# Patient Record
Sex: Male | Born: 1975 | Race: Black or African American | Hispanic: No | Marital: Single | Smoking: Current every day smoker
Health system: Southern US, Community
[De-identification: ages and names within clinical notes are randomized; demographics above are authoritative.]

## PROBLEM LIST (undated history)

## (undated) DIAGNOSIS — F259 Schizoaffective disorder, unspecified: Secondary | ICD-10-CM

## (undated) DIAGNOSIS — F319 Bipolar disorder, unspecified: Secondary | ICD-10-CM

## (undated) HISTORY — PX: JOINT REPLACEMENT: SHX530

---

## 2009-02-05 ENCOUNTER — Other Ambulatory Visit: Payer: Self-pay

## 2009-02-05 ENCOUNTER — Other Ambulatory Visit: Payer: Self-pay | Admitting: Emergency Medicine

## 2009-02-06 ENCOUNTER — Ambulatory Visit: Payer: Self-pay | Admitting: *Deleted

## 2009-02-06 ENCOUNTER — Inpatient Hospital Stay (HOSPITAL_COMMUNITY): Admission: AD | Admit: 2009-02-06 | Discharge: 2009-02-10 | Payer: Self-pay | Admitting: *Deleted

## 2009-02-23 ENCOUNTER — Emergency Department (HOSPITAL_COMMUNITY): Admission: EM | Admit: 2009-02-23 | Discharge: 2009-02-24 | Payer: Self-pay | Admitting: Emergency Medicine

## 2009-03-07 ENCOUNTER — Ambulatory Visit (HOSPITAL_COMMUNITY): Admission: RE | Admit: 2009-03-07 | Discharge: 2009-03-07 | Payer: Self-pay | Admitting: Unknown Physician Specialty

## 2009-04-21 ENCOUNTER — Inpatient Hospital Stay (HOSPITAL_COMMUNITY): Admission: EM | Admit: 2009-04-21 | Discharge: 2009-04-27 | Payer: Self-pay | Admitting: Emergency Medicine

## 2009-04-30 ENCOUNTER — Emergency Department (HOSPITAL_COMMUNITY): Admission: EM | Admit: 2009-04-30 | Discharge: 2009-04-30 | Payer: Self-pay | Admitting: Emergency Medicine

## 2009-05-07 ENCOUNTER — Emergency Department (HOSPITAL_COMMUNITY): Admission: EM | Admit: 2009-05-07 | Discharge: 2009-05-08 | Payer: Self-pay | Admitting: Emergency Medicine

## 2009-10-02 ENCOUNTER — Other Ambulatory Visit: Payer: Self-pay | Admitting: Emergency Medicine

## 2009-10-03 ENCOUNTER — Ambulatory Visit: Payer: Self-pay | Admitting: Psychiatry

## 2009-10-04 ENCOUNTER — Inpatient Hospital Stay (HOSPITAL_COMMUNITY): Admission: AD | Admit: 2009-10-04 | Discharge: 2009-10-06 | Payer: Self-pay | Admitting: Psychiatry

## 2010-01-22 ENCOUNTER — Emergency Department (HOSPITAL_COMMUNITY): Admission: EM | Admit: 2010-01-22 | Discharge: 2010-01-22 | Payer: Self-pay | Admitting: Emergency Medicine

## 2010-04-19 ENCOUNTER — Emergency Department (HOSPITAL_COMMUNITY): Admission: EM | Admit: 2010-04-19 | Discharge: 2010-04-19 | Payer: Self-pay | Admitting: Family Medicine

## 2010-06-14 ENCOUNTER — Emergency Department (HOSPITAL_COMMUNITY): Admission: EM | Admit: 2010-06-14 | Discharge: 2010-06-14 | Payer: Self-pay | Admitting: Emergency Medicine

## 2010-06-19 ENCOUNTER — Emergency Department (HOSPITAL_COMMUNITY): Admission: EM | Admit: 2010-06-19 | Discharge: 2010-06-19 | Payer: Self-pay | Admitting: Emergency Medicine

## 2010-07-05 ENCOUNTER — Emergency Department (HOSPITAL_COMMUNITY)
Admission: EM | Admit: 2010-07-05 | Discharge: 2010-07-05 | Payer: Self-pay | Source: Home / Self Care | Admitting: Emergency Medicine

## 2010-11-28 ENCOUNTER — Emergency Department (HOSPITAL_COMMUNITY)
Admission: EM | Admit: 2010-11-28 | Discharge: 2010-11-28 | Payer: Self-pay | Source: Home / Self Care | Admitting: Family Medicine

## 2011-01-22 ENCOUNTER — Inpatient Hospital Stay (INDEPENDENT_AMBULATORY_CARE_PROVIDER_SITE_OTHER)
Admission: RE | Admit: 2011-01-22 | Discharge: 2011-01-22 | Disposition: A | Discharge status: Home / Self Care | Payer: Medicare Other | Source: Ambulatory Visit | Attending: Emergency Medicine | Admitting: Emergency Medicine

## 2011-01-22 ENCOUNTER — Ambulatory Visit (INDEPENDENT_AMBULATORY_CARE_PROVIDER_SITE_OTHER): Payer: Self-pay

## 2011-01-22 DIAGNOSIS — L02219 Cutaneous abscess of trunk, unspecified: Secondary | ICD-10-CM

## 2011-01-24 ENCOUNTER — Inpatient Hospital Stay (INDEPENDENT_AMBULATORY_CARE_PROVIDER_SITE_OTHER)
Admission: RE | Admit: 2011-01-24 | Discharge: 2011-01-24 | Disposition: A | Discharge status: Home / Self Care | Payer: Medicare Other | Source: Ambulatory Visit | Attending: Family Medicine | Admitting: Family Medicine

## 2011-01-24 DIAGNOSIS — L02219 Cutaneous abscess of trunk, unspecified: Secondary | ICD-10-CM

## 2011-01-25 LAB — CULTURE, ROUTINE-ABSCESS

## 2011-01-26 ENCOUNTER — Inpatient Hospital Stay (INDEPENDENT_AMBULATORY_CARE_PROVIDER_SITE_OTHER)
Admission: RE | Admit: 2011-01-26 | Discharge: 2011-01-26 | Disposition: A | Discharge status: Home / Self Care | Payer: Medicare Other | Source: Ambulatory Visit

## 2011-01-26 DIAGNOSIS — L02219 Cutaneous abscess of trunk, unspecified: Secondary | ICD-10-CM

## 2011-01-28 ENCOUNTER — Inpatient Hospital Stay (HOSPITAL_COMMUNITY)
Admission: RE | Admit: 2011-01-28 | Discharge: 2011-01-28 | Disposition: A | Discharge status: Home / Self Care | Payer: Medicare Other | Source: Ambulatory Visit | Attending: Emergency Medicine | Admitting: Emergency Medicine

## 2011-02-18 LAB — RPR: RPR Ser Ql: NONREACTIVE

## 2011-02-18 LAB — GC/CHLAMYDIA PROBE AMP, GENITAL: Chlamydia, DNA Probe: NEGATIVE

## 2011-02-24 LAB — URINALYSIS, ROUTINE W REFLEX MICROSCOPIC: Ketones, ur: NEGATIVE mg/dL

## 2011-02-24 LAB — COMPREHENSIVE METABOLIC PANEL
AST: 43 U/L — ABNORMAL HIGH (ref 0–37)
Albumin: 3.8 g/dL (ref 3.5–5.2)
BUN: 6 mg/dL (ref 6–23)
Calcium: 9 mg/dL (ref 8.4–10.5)
Chloride: 106 mEq/L (ref 96–112)
GFR calc Af Amer: >60 mL/min (ref >60)
Total Bilirubin: 1.3 mg/dL — ABNORMAL HIGH (ref 0.3–1.2)
Total Protein: 5.8 g/dL — ABNORMAL LOW (ref 6.0–8.3)

## 2011-02-24 LAB — CBC
Hemoglobin: 15.1 g/dL (ref 13.0–17.0)
MCHC: 34 g/dL (ref 30.0–36.0)
Platelets: 175 10*3/uL (ref 150–400)
RBC: 4.72 MIL/uL (ref 4.22–5.81)
RDW: 14.1 % (ref 11.5–15.5)

## 2011-02-24 LAB — DIFFERENTIAL
Lymphocytes Relative: 26 % (ref 12–46)
Lymphs Abs: 1.2 10*3/uL (ref 0.7–4.0)
Monocytes Relative: 9 % (ref 3–12)
Neutro Abs: 2.9 10*3/uL (ref 1.7–7.7)
Neutrophils Relative %: 63 % (ref 43–77)

## 2011-02-24 LAB — LIPASE, BLOOD: Lipase: 36 U/L (ref 11–59)

## 2011-02-26 LAB — POCT URINALYSIS DIP (DEVICE)
Bilirubin Urine: NEGATIVE
Glucose, UA: NEGATIVE mg/dL
Hgb urine dipstick: NEGATIVE
Ketones, ur: NEGATIVE mg/dL
Specific Gravity, Urine: 1.015 (ref 1.005–1.030)
Urobilinogen, UA: 4 mg/dL — ABNORMAL HIGH (ref 0.0–1.0)

## 2011-02-26 LAB — CK: Total CK: 131 U/L (ref 7–232)

## 2011-02-26 LAB — POCT I-STAT, CHEM 8
BUN: 10 mg/dL (ref 6–23)
Creatinine, Ser: 1 mg/dL (ref 0.4–1.5)
Potassium: 4.2 mEq/L (ref 3.5–5.1)
Sodium: 143 mEq/L (ref 135–145)
TCO2: 27 mmol/L (ref 0–100)

## 2011-03-14 LAB — DIFFERENTIAL
Basophils Absolute: 0 10*3/uL (ref 0.0–0.1)
Basophils Relative: 0 % (ref 0–1)
Eosinophils Absolute: 0.1 10*3/uL (ref 0.0–0.7)
Eosinophils Relative: 2 % (ref 0–5)
Lymphocytes Relative: 26 % (ref 12–46)
Lymphs Abs: 1.7 10*3/uL (ref 0.7–4.0)
Monocytes Absolute: 0.4 10*3/uL (ref 0.1–1.0)
Monocytes Relative: 7 % (ref 3–12)
Neutro Abs: 4.2 10*3/uL (ref 1.7–7.7)
Neutrophils Relative %: 65 % (ref 43–77)

## 2011-03-14 LAB — CBC
HCT: 46.3 % (ref 39.0–52.0)
Hemoglobin: 15.5 g/dL (ref 13.0–17.0)
MCHC: 33.5 g/dL (ref 30.0–36.0)
Platelets: 192 10*3/uL (ref 150–400)
RDW: 14.1 % (ref 11.5–15.5)

## 2011-03-14 LAB — RAPID URINE DRUG SCREEN, HOSP PERFORMED
Amphetamines: NOT DETECTED
Barbiturates: NOT DETECTED
Benzodiazepines: NOT DETECTED
Cocaine: NOT DETECTED
Opiates: NOT DETECTED
Tetrahydrocannabinol: NOT DETECTED

## 2011-03-14 LAB — BASIC METABOLIC PANEL
BUN: 14 mg/dL (ref 6–23)
CO2: 27 mEq/L (ref 19–32)
Calcium: 9.6 mg/dL (ref 8.4–10.5)
Chloride: 107 mEq/L (ref 96–112)
Creatinine, Ser: 1.14 mg/dL (ref 0.4–1.5)
GFR calc Af Amer: >60 mL/min (ref >60)
GFR calc non Af Amer: >60 mL/min (ref >60)
Glucose, Bld: 124 mg/dL — ABNORMAL HIGH (ref 70–99)
Potassium: 3.6 mEq/L (ref 3.5–5.1)
Sodium: 141 mEq/L (ref 135–145)

## 2011-03-14 LAB — ETHANOL: Alcohol, Ethyl (B): <5 mg/dL (ref 0–10)

## 2011-03-19 LAB — CBC
HCT: 41.8 % (ref 39.0–52.0)
Hemoglobin: 14.2 g/dL (ref 13.0–17.0)
MCV: 91 fL (ref 78.0–100.0)
MCV: 92.7 fL (ref 78.0–100.0)
Platelets: 141 10*3/uL — ABNORMAL LOW (ref 150–400)
RBC: 4.17 MIL/uL — ABNORMAL LOW (ref 4.22–5.81)
RBC: 4.56 MIL/uL (ref 4.22–5.81)
WBC: 14.8 10*3/uL — ABNORMAL HIGH (ref 4.0–10.5)
WBC: 5.7 10*3/uL (ref 4.0–10.5)

## 2011-03-19 LAB — BASIC METABOLIC PANEL
BUN: 4 mg/dL — ABNORMAL LOW (ref 6–23)
BUN: 6 mg/dL (ref 6–23)
Calcium: 8.6 mg/dL (ref 8.4–10.5)
Calcium: 8.7 mg/dL (ref 8.4–10.5)
Calcium: 8.9 mg/dL (ref 8.4–10.5)
Chloride: 106 mEq/L (ref 96–112)
Chloride: 112 mEq/L (ref 96–112)
Creatinine, Ser: 0.74 mg/dL (ref 0.4–1.5)
Creatinine, Ser: 0.86 mg/dL (ref 0.4–1.5)
Creatinine, Ser: 1.06 mg/dL (ref 0.4–1.5)
GFR calc Af Amer: >60 mL/min (ref >60)
GFR calc Af Amer: >60 mL/min (ref >60)
GFR calc Af Amer: >60 mL/min (ref >60)
GFR calc non Af Amer: >60 mL/min (ref >60)
GFR calc non Af Amer: >60 mL/min (ref >60)
GFR calc non Af Amer: >60 mL/min (ref >60)
Potassium: 3.6 mEq/L (ref 3.5–5.1)
Potassium: 4 mEq/L (ref 3.5–5.1)
Sodium: 141 mEq/L (ref 135–145)

## 2011-03-19 LAB — DIFFERENTIAL
Eosinophils Absolute: 0.1 10*3/uL (ref 0.0–0.7)
Lymphocytes Relative: 10 % — ABNORMAL LOW (ref 12–46)
Lymphs Abs: 1.2 10*3/uL (ref 0.7–4.0)
Lymphs Abs: 1.9 10*3/uL (ref 0.7–4.0)
Monocytes Absolute: 0.5 10*3/uL (ref 0.1–1.0)
Monocytes Relative: 4 % (ref 3–12)
Neutro Abs: 11 10*3/uL — ABNORMAL HIGH (ref 1.7–7.7)
Neutro Abs: 3.3 10*3/uL (ref 1.7–7.7)
Neutrophils Relative %: 59 % (ref 43–77)

## 2011-03-19 LAB — RAPID URINE DRUG SCREEN, HOSP PERFORMED
Amphetamines: NOT DETECTED
Cocaine: NOT DETECTED
Opiates: NOT DETECTED
Tetrahydrocannabinol: NOT DETECTED

## 2011-03-19 LAB — URINALYSIS, ROUTINE W REFLEX MICROSCOPIC
Glucose, UA: NEGATIVE mg/dL
Hgb urine dipstick: NEGATIVE
Protein, ur: 30 mg/dL — AB
Specific Gravity, Urine: 1.029 (ref 1.005–1.030)

## 2011-03-19 LAB — URINE MICROSCOPIC-ADD ON

## 2011-03-19 LAB — BLOOD GAS, ARTERIAL
Bicarbonate: 24.3 mEq/L — ABNORMAL HIGH (ref 20.0–24.0)
TCO2: 25.6 mmol/L (ref 0–100)
pCO2 arterial: 41.3 mmHg (ref 35.0–45.0)
pH, Arterial: 7.388 (ref 7.350–7.450)

## 2011-03-19 LAB — PROTIME-INR
INR: 1.1 (ref 0.00–1.49)
Prothrombin Time: 14.4 seconds (ref 11.6–15.2)

## 2011-03-19 LAB — CHOLESTEROL, TOTAL: Cholesterol: 146 mg/dL (ref 0–200)

## 2011-03-19 LAB — TYPE AND SCREEN: Antibody Screen: NEGATIVE

## 2011-03-19 LAB — ETHANOL: Alcohol, Ethyl (B): <5 mg/dL (ref 0–10)

## 2011-03-26 LAB — BASIC METABOLIC PANEL
CO2: 29 mEq/L (ref 19–32)
Chloride: 102 mEq/L (ref 96–112)
GFR calc non Af Amer: >60 mL/min (ref >60)
Glucose, Bld: 106 mg/dL — ABNORMAL HIGH (ref 70–99)
Potassium: 3.9 mEq/L (ref 3.5–5.1)
Sodium: 140 mEq/L (ref 135–145)

## 2011-03-26 LAB — CBC
HCT: 47 % (ref 39.0–52.0)
Hemoglobin: 15.6 g/dL (ref 13.0–17.0)
MCHC: 33.1 g/dL (ref 30.0–36.0)
MCV: 92.6 fL (ref 78.0–100.0)
RDW: 13.4 % (ref 11.5–15.5)

## 2011-03-26 LAB — DIFFERENTIAL
Basophils Absolute: 0 10*3/uL (ref 0.0–0.1)
Eosinophils Relative: 1 % (ref 0–5)
Lymphocytes Relative: 25 % (ref 12–46)
Monocytes Absolute: 0.4 10*3/uL (ref 0.1–1.0)

## 2011-03-26 LAB — RAPID URINE DRUG SCREEN, HOSP PERFORMED: Benzodiazepines: NOT DETECTED
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
## 2011-04-23 NOTE — Discharge Summary (Signed)
[name], [name]              ACCOUNT NO.:  [id number]   MEDICAL RECORD NO.:  [id number]          PATIENT TYPE:  INP   LOCATION:  5151                         FACILITY:  [facility]   PHYSICIAN:  Gabrielle Dare. Janee Morn, M.D.DATE OF BIRTH:  [birth year]   DATE OF ADMISSION:  04/20/2009  DATE OF DISCHARGE:                               DISCHARGE SUMMARY   ADDENDUM     Since the prior discharge summary, the patient has been stable from a  medical standpoint.  It was discovered that the patient could not obtain  a full liquid diet at an assisted living facility.  Therefore, search  was undertaken for a skilled nursing facility where he could get proper  nutrition.  A facility was located.  He was able to be transferred there  in good condition.  None of his medications had changed since the last  dictation nor have his follow-up plans changed.      Earney Hamburg, P.A.      Gabrielle Dare Janee Morn, M.D.  Electronically Signed    MJ/MEDQ  D:  04/27/2009  T:  04/27/2009  Job:  [id number]
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
## 2011-06-03 ENCOUNTER — Emergency Department (HOSPITAL_COMMUNITY)
Admission: EM | Admit: 2011-06-03 | Discharge: 2011-06-03 | Payer: Medicare Other | Attending: Emergency Medicine | Admitting: Emergency Medicine

## 2011-06-03 ENCOUNTER — Emergency Department (HOSPITAL_COMMUNITY): Payer: Medicare Other

## 2011-06-03 DIAGNOSIS — I498 Other specified cardiac arrhythmias: Secondary | ICD-10-CM | POA: Insufficient documentation

## 2011-06-03 DIAGNOSIS — R569 Unspecified convulsions: Secondary | ICD-10-CM | POA: Insufficient documentation

## 2011-06-03 DIAGNOSIS — F79 Unspecified intellectual disabilities: Secondary | ICD-10-CM | POA: Insufficient documentation

## 2011-06-03 DIAGNOSIS — I10 Essential (primary) hypertension: Secondary | ICD-10-CM | POA: Insufficient documentation

## 2011-06-03 DIAGNOSIS — Z046 Encounter for general psychiatric examination, requested by authority: Secondary | ICD-10-CM | POA: Insufficient documentation

## 2011-06-03 LAB — CBC
Hemoglobin: 14.7 g/dL (ref 13.0–17.0)
MCH: 30.1 pg (ref 26.0–34.0)
RBC: 4.89 MIL/uL (ref 4.22–5.81)

## 2011-06-03 LAB — DIFFERENTIAL
Basophils Absolute: 0 10*3/uL (ref 0.0–0.1)
Basophils Relative: 0 % (ref 0–1)
Monocytes Relative: 10 % (ref 3–12)
Neutro Abs: 5.4 10*3/uL (ref 1.7–7.7)
Neutrophils Relative %: 74 % (ref 43–77)

## 2011-06-03 LAB — RAPID URINE DRUG SCREEN, HOSP PERFORMED
Amphetamines: NOT DETECTED
Benzodiazepines: POSITIVE — AB
Cocaine: NOT DETECTED
Opiates: NOT DETECTED
Tetrahydrocannabinol: NOT DETECTED

## 2011-06-03 LAB — BASIC METABOLIC PANEL
CO2: 26 mEq/L (ref 19–32)
Calcium: 9.7 mg/dL (ref 8.4–10.5)
Creatinine, Ser: 1.07 mg/dL (ref 0.50–1.35)
Glucose, Bld: 92 mg/dL (ref 70–99)

## 2012-11-04 IMAGING — CR DG ABDOMEN 1V
2 series · 2 of 2 positions shown · non-contrast
Comparison: CT 06/14/2010

CLINICAL DATA: Right upper quadrant abscess

ABDOMEN - 1 VIEW

[view not recorded (1 of 2)]
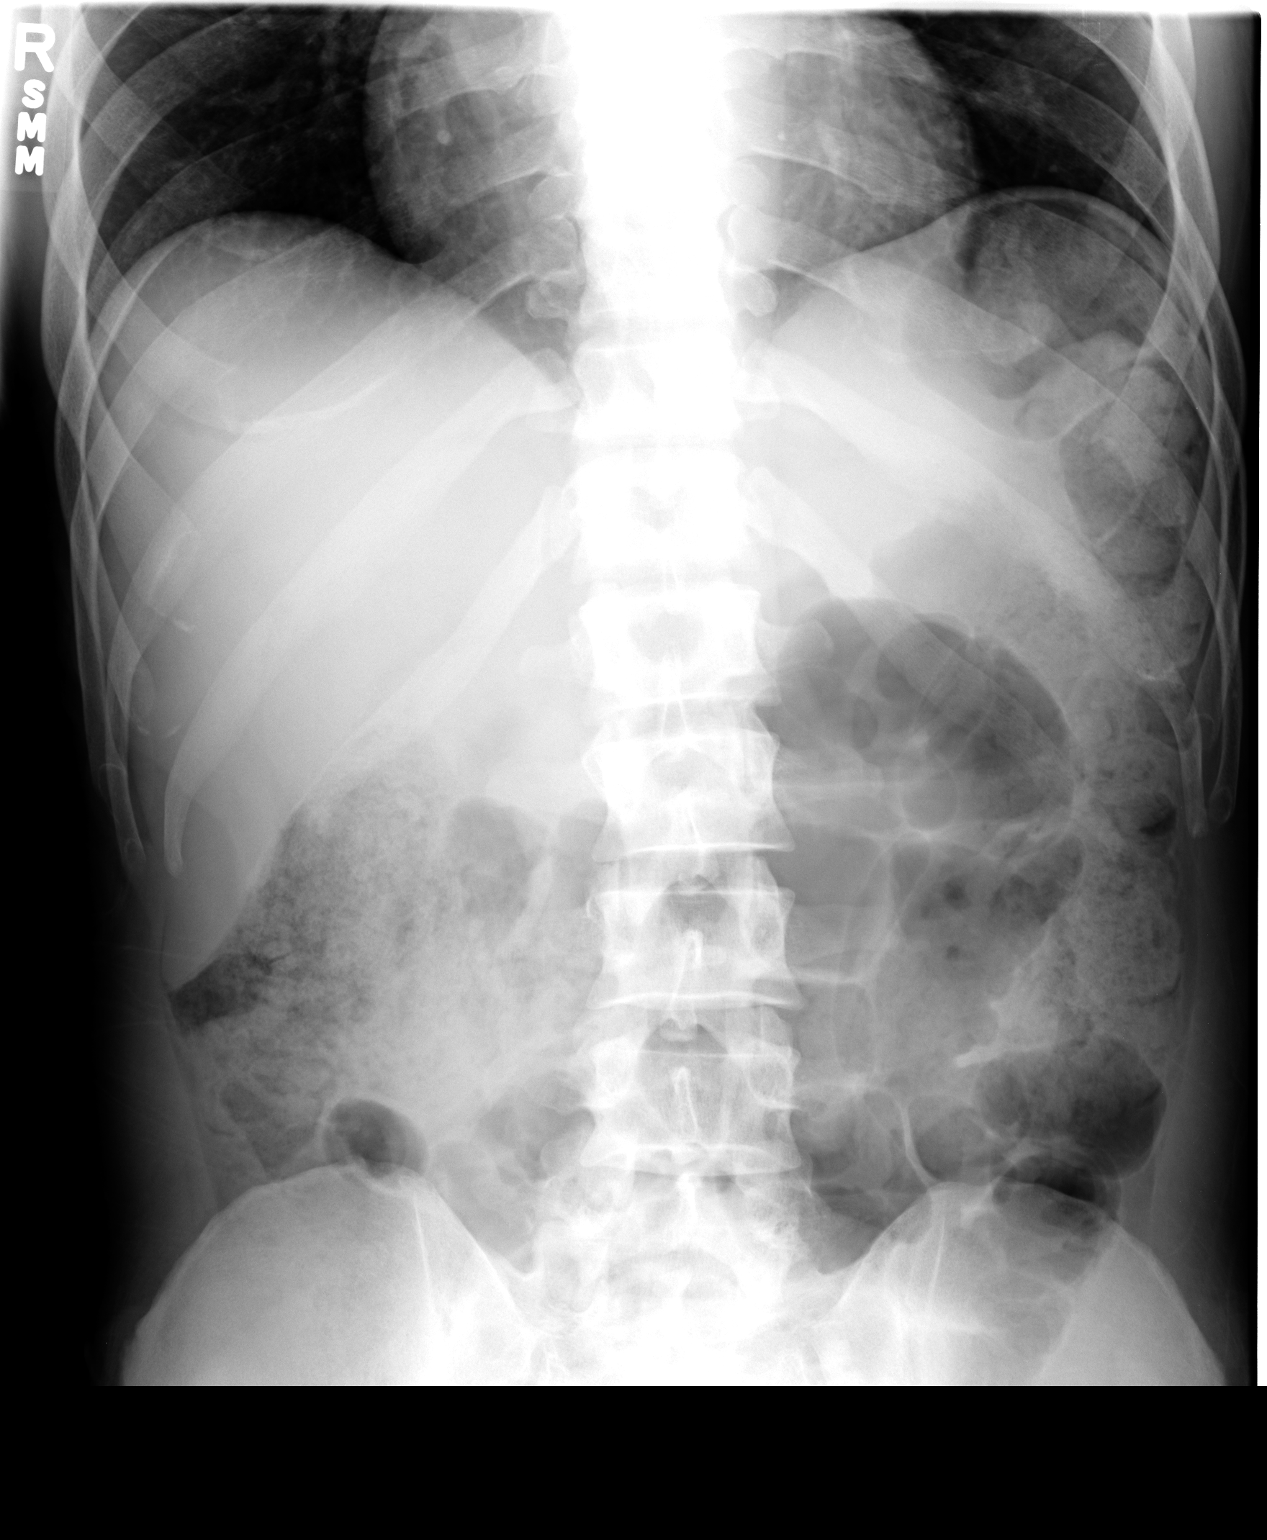

[view not recorded (2 of 2)]
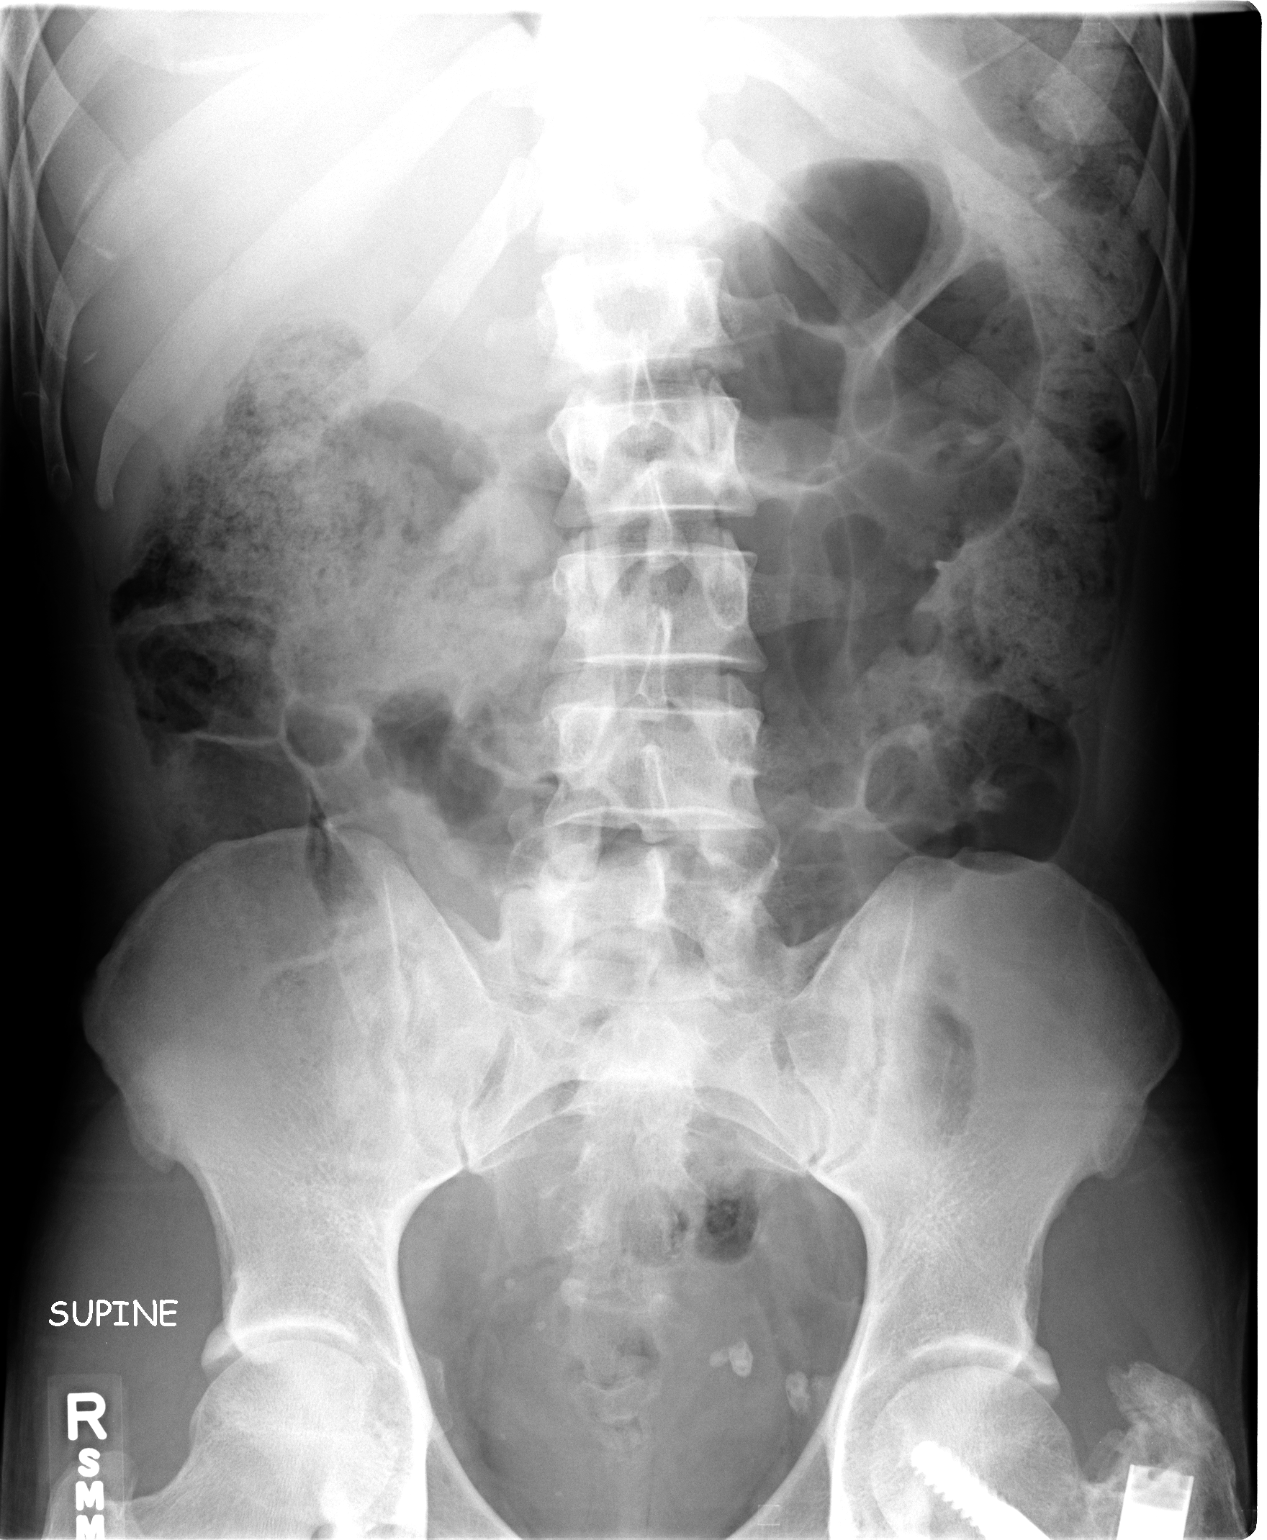

[2 of 2 positions shown; findings below may reference images not displayed]

FINDINGS: Lung bases are clear.  No dilated loops of large or small
bowel.  There is a moderate volume of stool in the ascending and
descending colon.  There is gas the rectosigmoid colon.

No evidence of abscess in the right upper quadrant.  Left hip
fixation.
IMPRESSION: No acute abdominal process.  No  abnormality the right upper
quadrant.

Moderate volume stool suggests mild constipation.

## 2013-03-16 IMAGING — CT CT HEAD W/O CM
1 series · 16 of 30 positions shown, 20 images · non-contrast
Comparison: 06/19/2010

CLINICAL DATA: Pain post seizure

CT HEAD WITHOUT CONTRAST
TECHNIQUE: Contiguous axial images were obtained from the base of
the skull through the vertex without contrast.

[Series 2: head_seq 4.5 h37s st · axial · 0.43mm/px · z∈[-191,-29]mm · 16 of 40 slices shown, 20 images]
[im 2/40  brain]
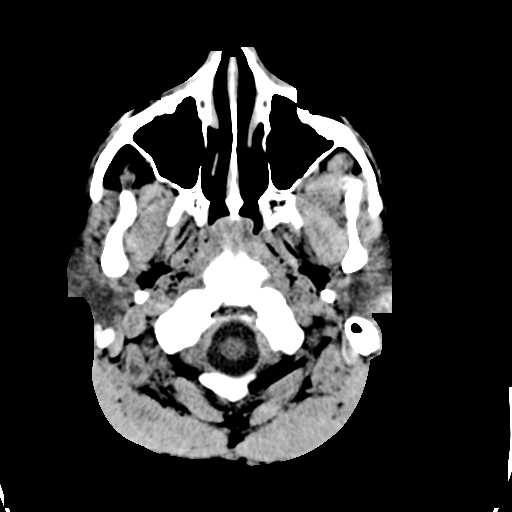
[im 2/40  bone]
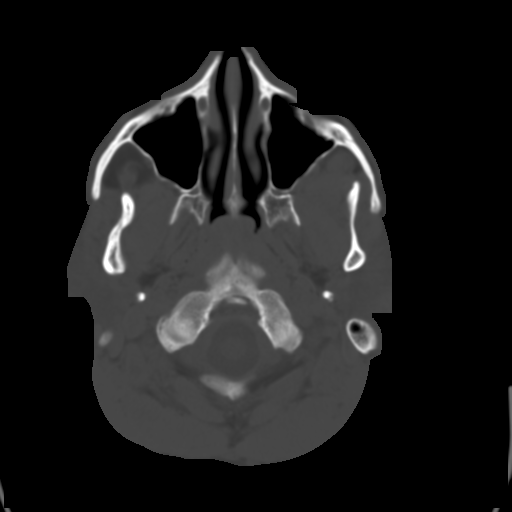
[im 5/40  brain]
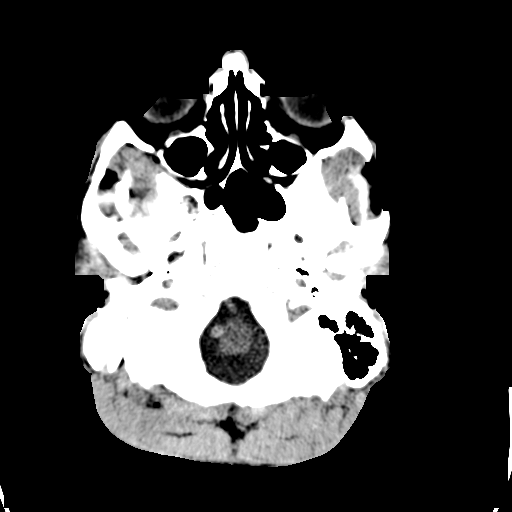
[im 7/40  brain]
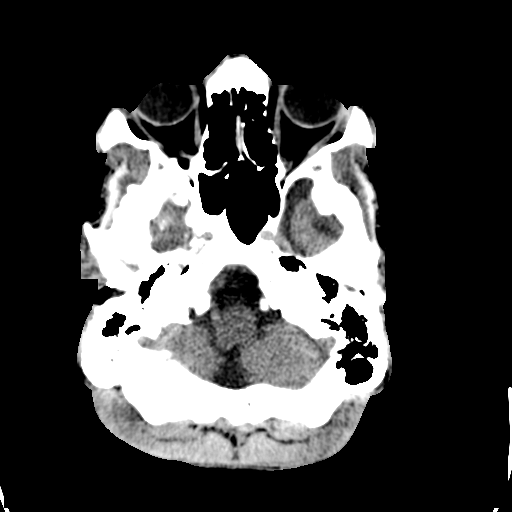
[im 10/40  brain]
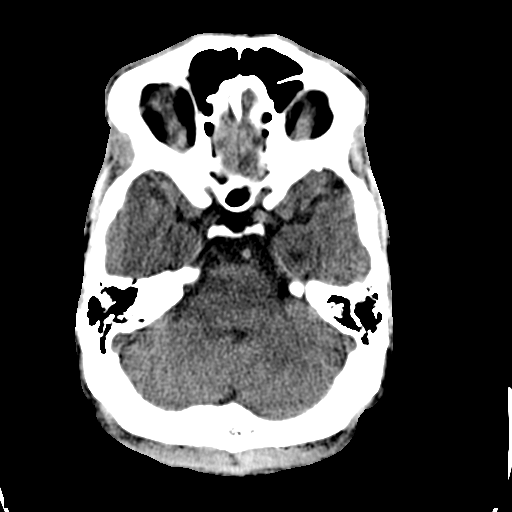
[im 11/40  brain]
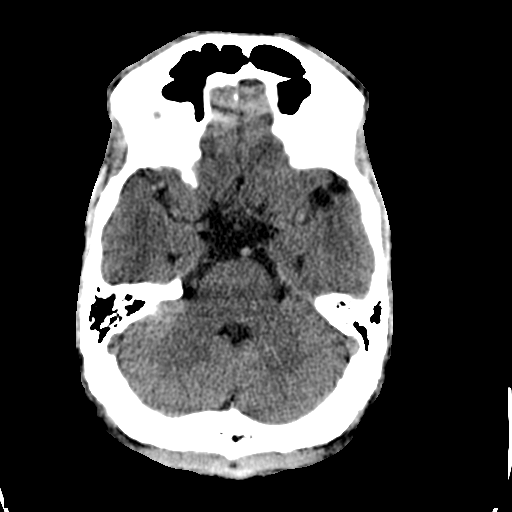
[im 11/40  bone]
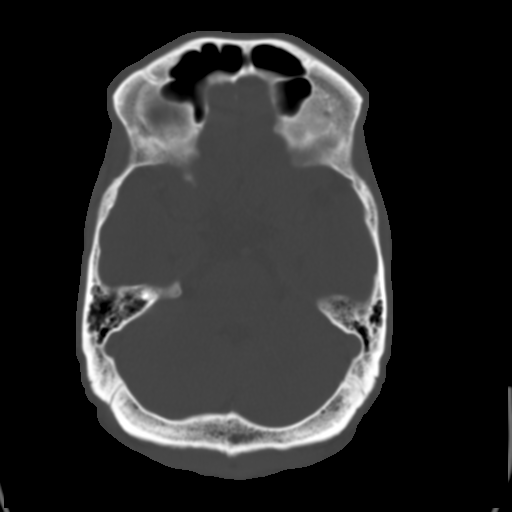
[im 14/40  brain]
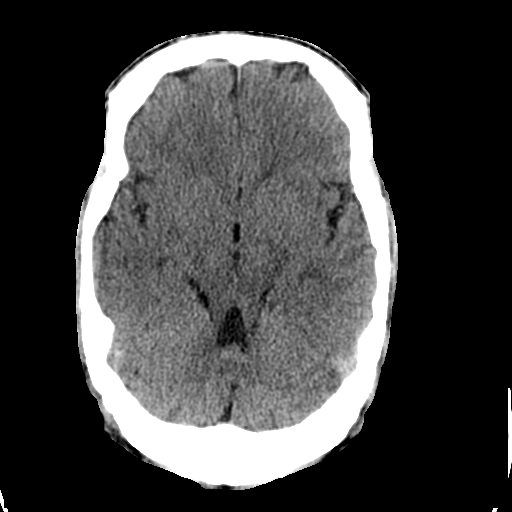
[im 17/40  brain]
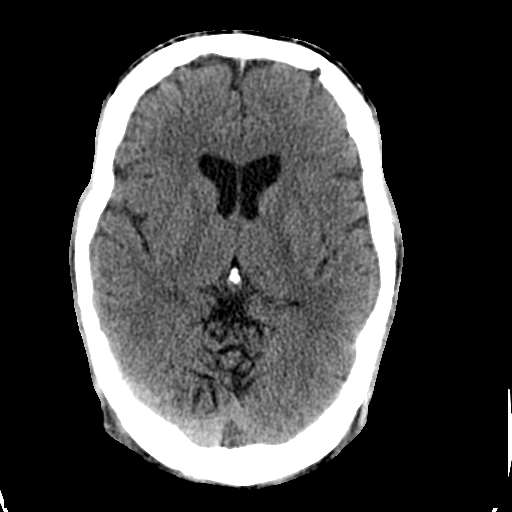
[im 19/40  brain]
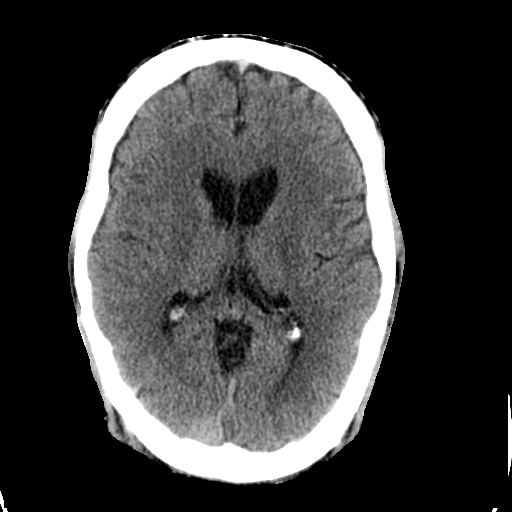
[im 21/40  brain]
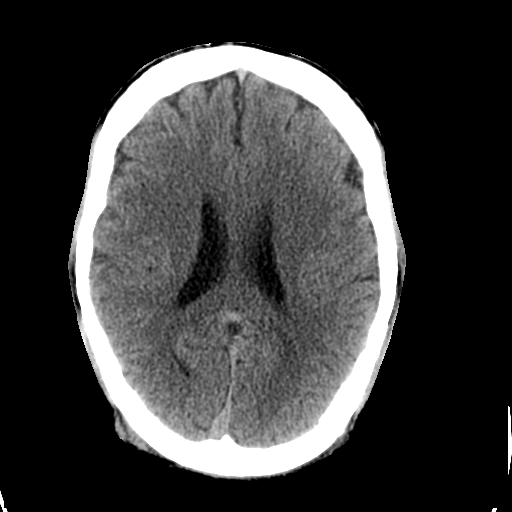
[im 21/40  bone]
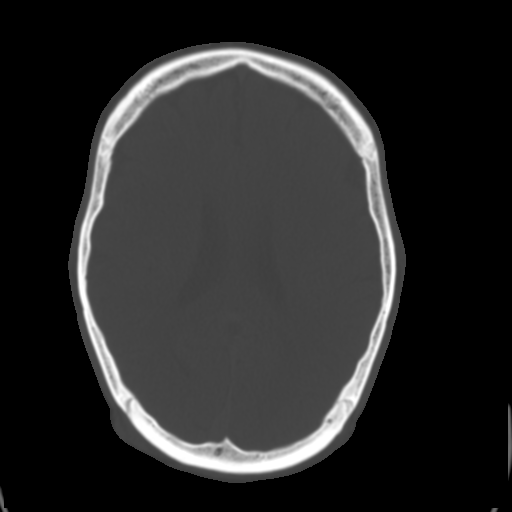
[im 23/40  brain]
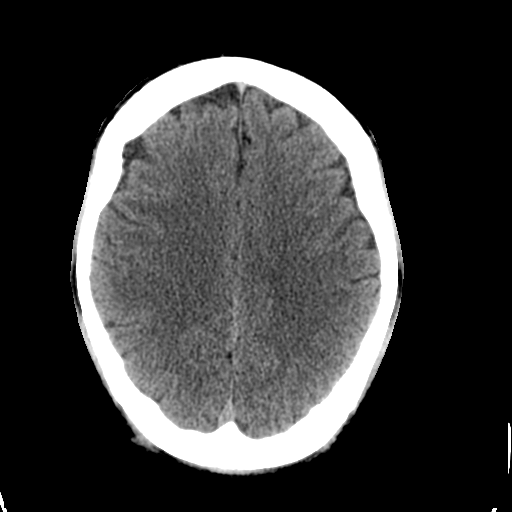
[im 26/40  brain]
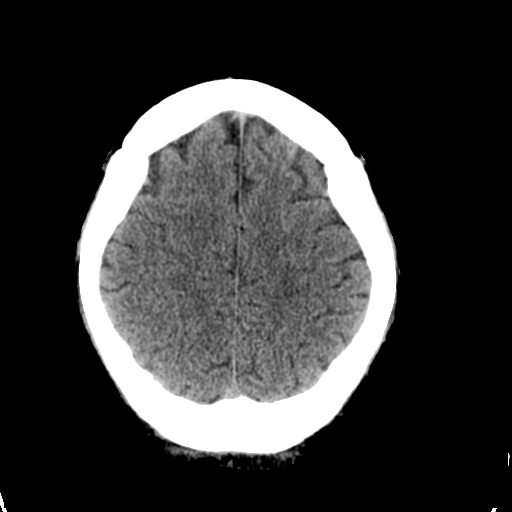
[im 29/40  brain]
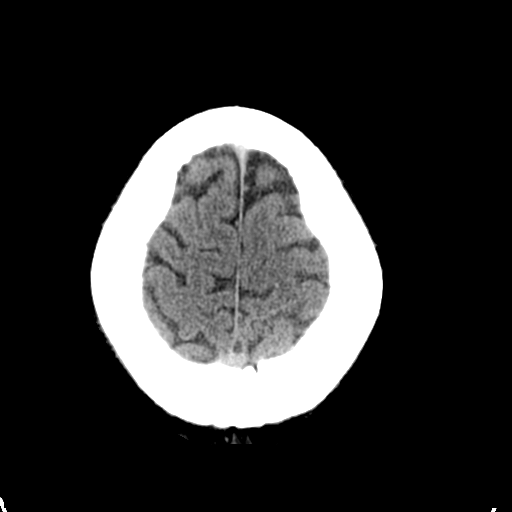
[im 30/40  brain]
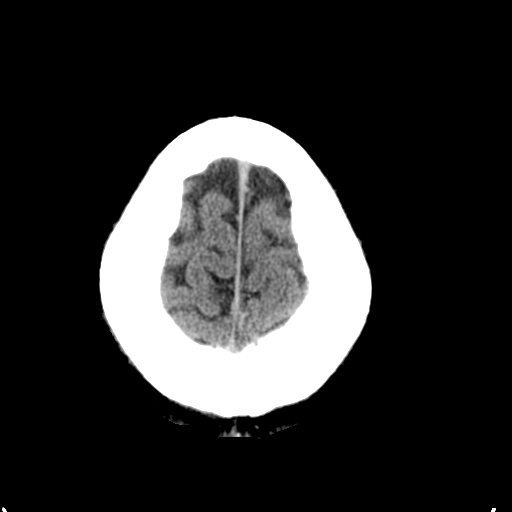
[im 30/40  bone]
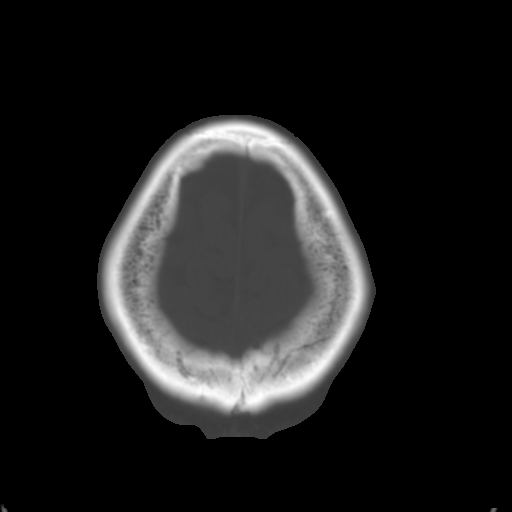
[im 33/40  brain]
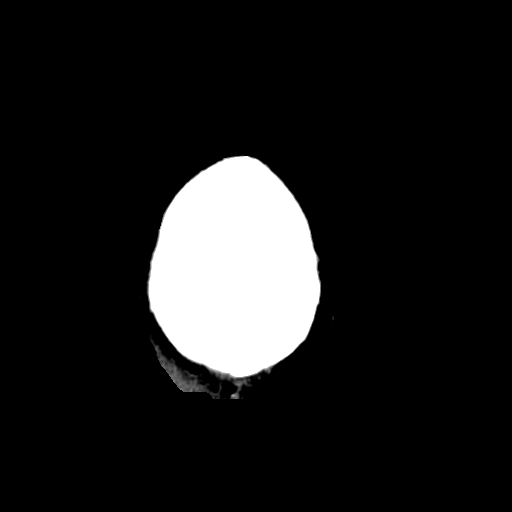
[im 35/40  brain]
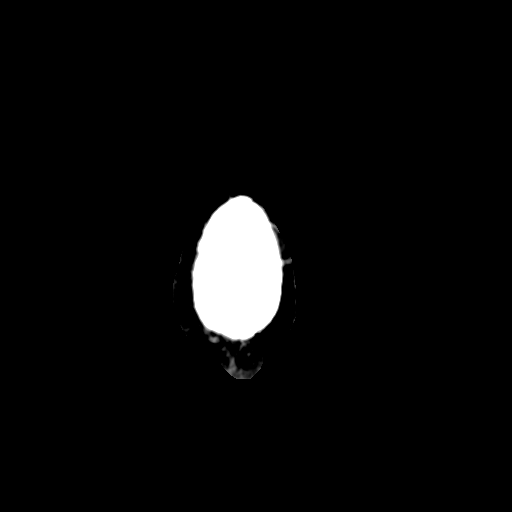
[im 38/40  brain]
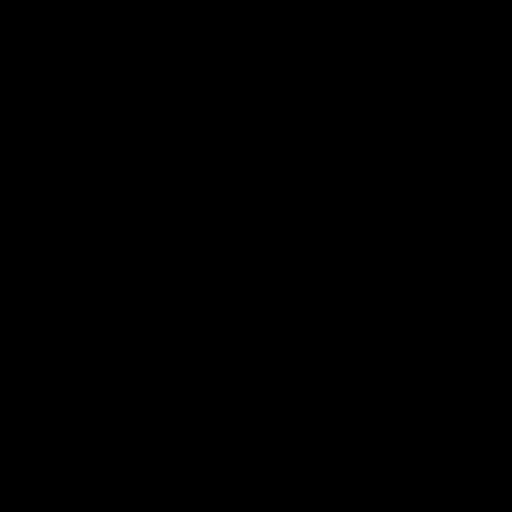

[16 of 30 positions shown; findings below may reference images not displayed]

FINDINGS: No skull fracture is noted.  Paranasal sinuses and
mastoid air cells are unremarkable.

No intracranial hemorrhage, mass effect or midline shift.  No acute
infarction.  No mass lesion is noted on this unenhanced scan.  The
gray and white matter differentiation is preserved.  No intra or
extra-axial fluid collection.
IMPRESSION: No acute intracranial abnormality.

## 2013-08-27 ENCOUNTER — Emergency Department: Payer: Self-pay | Admitting: Emergency Medicine

## 2013-08-27 LAB — COMPREHENSIVE METABOLIC PANEL
Albumin: 3.8 g/dL (ref 3.4–5.0)
Anion Gap: 3 — ABNORMAL LOW (ref 7–16)
BUN: 9 mg/dL (ref 7–18)
Bilirubin,Total: 0.9 mg/dL (ref 0.2–1.0)
Calcium, Total: 9.4 mg/dL (ref 8.5–10.1)
Co2: 29 mmol/L (ref 21–32)
Glucose: 116 mg/dL — ABNORMAL HIGH (ref 65–99)
Osmolality: 279 (ref 275–301)
Potassium: 3.5 mmol/L (ref 3.5–5.1)

## 2013-08-27 LAB — CBC
MCV: 92 fL (ref 80–100)
WBC: 5.6 10*3/uL (ref 3.8–10.6)

## 2013-08-27 LAB — URINALYSIS, COMPLETE
Bacteria: NONE SEEN
Bilirubin,UR: NEGATIVE
Blood: NEGATIVE
Glucose,UR: NEGATIVE mg/dL (ref 0–75)
Ketone: NEGATIVE
Nitrite: NEGATIVE
Ph: 6 (ref 4.5–8.0)
Protein: NEGATIVE
Squamous Epithelial: NONE SEEN

## 2013-08-27 LAB — DRUG SCREEN, URINE
Barbiturates, Ur Screen: NEGATIVE (ref ?–200)
Benzodiazepine, Ur Scrn: NEGATIVE (ref ?–200)
Methadone, Ur Screen: NEGATIVE (ref ?–300)
Phencyclidine (PCP) Ur S: NEGATIVE (ref ?–25)

## 2013-08-27 LAB — ETHANOL
Ethanol %: <0.003 % (ref 0.000–0.080)
Ethanol: <3 mg/dL

## 2013-08-27 LAB — TSH: Thyroid Stimulating Horm: 0.81 u[IU]/mL
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
## 2021-01-25 ENCOUNTER — Other Ambulatory Visit: Payer: Self-pay

## 2021-01-25 ENCOUNTER — Emergency Department
Admission: EM | Admit: 2021-01-25 | Discharge: 2021-01-25 | Disposition: A | Discharge status: Home / Self Care | Payer: Medicare Other | Attending: Emergency Medicine | Admitting: Emergency Medicine

## 2021-01-25 DIAGNOSIS — F17210 Nicotine dependence, cigarettes, uncomplicated: Secondary | ICD-10-CM | POA: Diagnosis not present

## 2021-01-25 DIAGNOSIS — Z9660 Presence of unspecified orthopedic joint implant: Secondary | ICD-10-CM | POA: Insufficient documentation

## 2021-01-25 DIAGNOSIS — K625 Hemorrhage of anus and rectum: Secondary | ICD-10-CM | POA: Diagnosis present

## 2021-01-25 DIAGNOSIS — K922 Gastrointestinal hemorrhage, unspecified: Secondary | ICD-10-CM | POA: Diagnosis not present

## 2021-01-25 LAB — COMPREHENSIVE METABOLIC PANEL
ALT: 43 U/L (ref 0–44)
AST: 39 U/L (ref 15–41)
Albumin: 4.2 g/dL (ref 3.5–5.0)
Alkaline Phosphatase: 155 U/L — ABNORMAL HIGH (ref 38–126)
Anion gap: 8 (ref 5–15)
BUN: 10 mg/dL (ref 6–20)
CO2: 27 mmol/L (ref 22–32)
Calcium: 9.3 mg/dL (ref 8.9–10.3)
Chloride: 106 mmol/L (ref 98–111)
Creatinine, Ser: 0.97 mg/dL (ref 0.61–1.24)
GFR, Estimated: >60 mL/min (ref >60)
Glucose, Bld: 93 mg/dL (ref 70–99)
Potassium: 3.9 mmol/L (ref 3.5–5.1)
Sodium: 141 mmol/L (ref 135–145)
Total Bilirubin: 0.8 mg/dL (ref 0.3–1.2)
Total Protein: 7.1 g/dL (ref 6.5–8.1)

## 2021-01-25 LAB — CBC
HCT: 42.7 % (ref 39.0–52.0)
Hemoglobin: 14.1 g/dL (ref 13.0–17.0)
MCH: 30.4 pg (ref 26.0–34.0)
MCHC: 33 g/dL (ref 30.0–36.0)
MCV: 92 fL (ref 80.0–100.0)
Platelets: 140 10*3/uL — ABNORMAL LOW (ref 150–400)
RBC: 4.64 MIL/uL (ref 4.22–5.81)
RDW: 12.6 % (ref 11.5–15.5)
WBC: 5.3 10*3/uL (ref 4.0–10.5)
nRBC: 0 % (ref 0.0–0.2)

## 2021-01-25 LAB — PROTIME-INR
INR: 1 (ref 0.8–1.2)
Prothrombin Time: 13.1 seconds (ref 11.4–15.2)

## 2021-01-25 NOTE — ED Provider Notes (Signed)
[facility] Emergency Department Provider Note   ____________________________________________   I have reviewed the triage vital signs and the nursing notes.   HISTORY  Chief Complaint Rectal Bleeding   History limited by: Not Limited   HPI [name] is a 45 y.o. male who presents to the emergency department today because of concerns for rectal bleeding.  The patient had an episode of blood on his toilet paper 4 days ago.  He has not noticed any since.  He states he had a similar episode roughly 1 year ago.  He does come from a group home.  He denies any rectal pain.  Per the group home staff his mother was worried that he might have colon cancer since it runs in the family and that is why she wanted him evaluated today. He does have an appointment with his doctor next week.   Records reviewed.   History reviewed. No pertinent past medical history.  There are no problems to display for this patient.   Past Surgical History:  Procedure Laterality Date  . JOINT REPLACEMENT      Prior to Admission medications   Not on File    Allergies Patient has no allergy information on record.  No family history on file.  Social History Social History   Tobacco Use  . Smoking status: Current Every Day Smoker    Packs/day: 1.00    Types: Cigarettes  . Smokeless tobacco: Never Used  Substance Use Topics  . Alcohol use: Never    Review of Systems Constitutional: No fever/chills Eyes: No visual changes. ENT: No sore throat. Cardiovascular: Denies chest pain. Respiratory: Denies shortness of breath. Gastrointestinal: No abdominal pain.  No nausea, no vomiting.  No diarrhea.  Episode of rectal bleeding 4 days ago.  Genitourinary: Negative for dysuria. Musculoskeletal: Negative for back pain. Skin: Negative for rash. Neurological: Negative for headaches, focal weakness or numbness.  ____________________________________________   PHYSICAL  EXAM:  VITAL SIGNS: ED Triage Vitals [01/25/21 1750]  Enc Vitals Group     BP (!) 147/87     Pulse Rate 82     Resp 14     Temp 98.7 F (37.1 C)     Temp Source Oral     SpO2 99 %     Weight      Height 6' 2"  (1.88 m)     Head Circumference      Peak Flow      Pain Score 0   Constitutional: Alert and oriented.  Eyes: Conjunctivae are normal.  ENT      Head: Normocephalic and atraumatic.      Nose: No congestion/rhinnorhea.      Mouth/Throat: Mucous membranes are moist.      Neck: No stridor. Hematological/Lymphatic/Immunilogical: No cervical lymphadenopathy. Cardiovascular: Normal rate, regular rhythm.  No murmurs, rubs, or gallops.  Respiratory: Normal respiratory effort without tachypnea nor retractions. Breath sounds are clear and equal bilaterally. No wheezes/rales/rhonchi. Gastrointestinal: Soft and non tender. No rebound. No guarding.  Rectal: External hemorrhoid, brown stool on glove. GUIAC negative.  Musculoskeletal: Normal range of motion in all extremities. No lower extremity edema. Neurologic:  Normal speech and language. No gross focal neurologic deficits are appreciated.  Skin:  Skin is warm, dry and intact. No rash noted. Psychiatric: Mood and affect are normal. Speech and behavior are normal. Patient exhibits appropriate insight and judgment.  ____________________________________________    LABS (pertinent positives/negatives)  CMP wnl except alk phos 155 CBC wbc 5.3, hgb  14.1, plt 140  ____________________________________________   EKG  None  ____________________________________________    RADIOLOGY  None  ____________________________________________   PROCEDURES  Procedures  ____________________________________________   INITIAL IMPRESSION / ASSESSMENT AND PLAN / ED COURSE  Pertinent labs & imaging results that were available during my care of the patient were reviewed by me and considered in my medical decision making (see chart  for details).   Patient presented to the emergency department today because of concerns for an episode of rectal bleeding 4 days ago.  Patient is guaiac negative today does have an external hemorrhoid.  Did discuss with group home staff that best way to diagnose colon cancer would be colonoscopy so they should talk to primary care about obtaining that. ____________________________________________   FINAL CLINICAL IMPRESSION(S) / ED DIAGNOSES  Final diagnoses:  Lower GI bleed     Note: This dictation was prepared with Dragon dictation. Any transcriptional errors that result from this process are unintentional     Nance Pear, MD 01/25/21 1927

## 2021-01-25 NOTE — Discharge Instructions (Addendum)
Please seek medical attention for any high fevers, chest pain, shortness of breath, change in behavior, persistent vomiting, bloody stool or any other new or concerning symptoms.  

## 2021-01-25 NOTE — ED Triage Notes (Addendum)
Pt to ED from Group home   Pt reports 6 episodes of rectal bleeding since Sunday. Denies blood in bowel movements, denies diarrhea.   States the bleeding is only light amounts in his underwear and when he wipes. Denies any other symptoms. Denies rectal pain/ abdominal pain. Reports he doesn't think he has hemorrhoids.   Pt discussed with Dr Vicente Males.

## 2021-04-05 ENCOUNTER — Ambulatory Visit (INDEPENDENT_AMBULATORY_CARE_PROVIDER_SITE_OTHER): Payer: Medicare Other | Admitting: Gastroenterology

## 2021-04-05 ENCOUNTER — Other Ambulatory Visit: Payer: Self-pay

## 2021-04-05 ENCOUNTER — Encounter: Payer: Self-pay | Admitting: Gastroenterology

## 2021-04-05 VITALS — BP 116/74 | HR 76 | Ht 74.0 in | Wt 192.4 lb

## 2021-04-05 DIAGNOSIS — Z1211 Encounter for screening for malignant neoplasm of colon: Secondary | ICD-10-CM

## 2021-04-05 DIAGNOSIS — K648 Other hemorrhoids: Secondary | ICD-10-CM

## 2021-04-05 MED ORDER — SUPREP BOWEL PREP KIT 17.5-3.13-1.6 GM/177ML PO SOLN: 1.0000 | ORAL | 0 refills | Status: DC

## 2021-04-05 NOTE — Progress Notes (Signed)
Gastroenterology Consultation  Referring Provider:     Mickel Fuchs, MD Primary Care Physician:  Mickel Fuchs, MD Primary Gastroenterologist:  Dr. Servando Snare     Reason for Consultation:     Hemorrhoidal problems        HPI:   [name] is a 45 y.o. y/o male referred for consultation & management of hemorrhoidal problems by Dr. Butler Denmark, Sydnee Cabal, MD.  This patient comes in today with a report of having a family history of colon cancer.  The patient states he had colon cancer and a uncle but nobody else in the family.  He also reports that he has had problems with hemorrhoids in the past but has not had any rectal bleeding or problems in the recent past.  He denies any rectal bleeding for last few months.  He also reports that he is due for colonoscopy.  There is no report of any abdominal pain nausea vomiting fevers chills black stools or bloody stools.  History reviewed. No pertinent past medical history.  Past Surgical History:  Procedure Laterality Date  . JOINT REPLACEMENT      Prior to Admission medications   Medication Sig Start Date End Date Taking? Authorizing Provider  INVEGA TRINZA 546 MG/1.75ML SUSY Inject into the muscle. 03/14/21  Yes [provider]    History reviewed. No pertinent family history.   Social History   Tobacco Use  . Smoking status: Current Every Day Smoker    Packs/day: 1.00    Types: Cigarettes  . Smokeless tobacco: Never Used  Substance Use Topics  . Alcohol use: Never    Allergies as of 04/05/2021  . (No Known Allergies)    Review of Systems:    All systems reviewed and negative except where noted in HPI.   Physical Exam:  BP 116/74   Pulse 76   Ht 6\' 2"  (1.88 m)   Wt 192 lb 6.4 oz (87.3 kg)   BMI 24.70 kg/m  No LMP for male patient. General:   Alert,  Well-developed, well-nourished, pleasant and cooperative in NAD Head:  Normocephalic and atraumatic. Eyes:  Sclera clear, no icterus.   Conjunctiva pink. Ears:   Normal auditory acuity. Neck:  Supple; no masses or thyromegaly. Lungs:  Respirations even and unlabored.  Clear throughout to auscultation.   No wheezes, crackles, or rhonchi. No acute distress. Heart:  Regular rate and rhythm; no murmurs, clicks, rubs, or gallops. Abdomen:  Normal bowel sounds.  No bruits.  Soft, non-tender and non-distended without masses, hepatosplenomegaly or hernias noted.  No guarding or rebound tenderness.  Negative Carnett sign.   Rectal:  Deferred.  Pulses:  Normal pulses noted. Extremities:  No clubbing or edema.  No cyanosis. Neurologic:  Alert and oriented x3;  grossly normal neurologically. Skin:  Intact without significant lesions or rashes.  No jaundice. Lymph Nodes:  No significant cervical adenopathy. Psych:  Alert and cooperative. Normal mood and affect.  Imaging Studies: No results found.  Assessment and Plan:   [name] is a 45 y.o. y/o male who comes in today with a history of "hemorrhoidal problem" that do not seem to be an issue at the present time.  The patient has been doing well but is concerned because he has not had a colonoscopy in the past that he had an uncle with colon cancer.  The patient has been informed that a uncle with colon cancer does not increase his risk of colon cancer since he is not  a first-degree relative but since the patient is 45 years old and has never had a colonoscopy the patient will be set up for screening colonoscopy.  The patient has been explained the plan and agrees with it.    Midge Minium, MD. Clementeen Graham    Note: This dictation was prepared with Dragon dictation along with smaller phrase technology. Any transcriptional errors that result from this process are unintentional.

## 2021-04-10 ENCOUNTER — Encounter: Payer: Self-pay | Admitting: Certified Registered Nurse Anesthetist

## 2021-04-10 ENCOUNTER — Encounter
Admission: RE | Disposition: A | Discharge status: Home / Self Care | Payer: Self-pay | Source: Home / Self Care | Attending: Gastroenterology

## 2021-04-10 ENCOUNTER — Other Ambulatory Visit: Payer: Self-pay

## 2021-04-10 ENCOUNTER — Ambulatory Visit
Admission: RE | Admit: 2021-04-10 | Discharge: 2021-04-10 | Disposition: A | Discharge status: Home / Self Care | Payer: Medicare Other | Attending: Gastroenterology | Admitting: Gastroenterology

## 2021-04-10 DIAGNOSIS — Z538 Procedure and treatment not carried out for other reasons: Secondary | ICD-10-CM | POA: Diagnosis not present

## 2021-04-10 DIAGNOSIS — Z1211 Encounter for screening for malignant neoplasm of colon: Secondary | ICD-10-CM | POA: Diagnosis not present

## 2021-04-10 SURGERY — COLONOSCOPY WITH PROPOFOL
Anesthesia: General

## 2021-04-10 MED ORDER — SODIUM CHLORIDE 0.9 % IV SOLN: INTRAVENOUS | Status: DC

## 2021-04-10 MED ORDER — LIDOCAINE HCL (PF) 2 % IJ SOLN
INTRAMUSCULAR | Status: AC
  Filled 2021-04-10: qty 5

## 2021-04-10 MED ORDER — PROPOFOL 500 MG/50ML IV EMUL
INTRAVENOUS | Status: AC
  Filled 2021-04-10: qty 50

## 2021-04-10 NOTE — Progress Notes (Signed)
Patient ate the day of prep stool still brown

## 2021-04-17 ENCOUNTER — Telehealth: Payer: Self-pay

## 2021-04-17 NOTE — Telephone Encounter (Signed)
Patient wants to reschedule procedure. Flexible on dates. Please call

## 2021-04-18 ENCOUNTER — Other Ambulatory Visit: Payer: Self-pay

## 2021-04-18 DIAGNOSIS — Z1211 Encounter for screening for malignant neoplasm of colon: Secondary | ICD-10-CM

## 2021-04-18 MED ORDER — SUPREP BOWEL PREP KIT 17.5-3.13-1.6 GM/177ML PO SOLN: 1.0000 | ORAL | 0 refills | Status: AC

## 2021-04-18 NOTE — Telephone Encounter (Signed)
Pt has been rescheduled for his screening colonoscopy on Thursday, May 26th.

## 2021-05-03 ENCOUNTER — Ambulatory Visit: Payer: Medicare Other | Admitting: Anesthesiology

## 2021-05-03 ENCOUNTER — Encounter: Payer: Self-pay | Admitting: Gastroenterology

## 2021-05-03 ENCOUNTER — Ambulatory Visit
Admission: RE | Admit: 2021-05-03 | Discharge: 2021-05-03 | Disposition: A | Discharge status: Home / Self Care | Payer: Medicare Other | Source: Ambulatory Visit | Attending: Gastroenterology | Admitting: Gastroenterology

## 2021-05-03 ENCOUNTER — Encounter
Admission: RE | Disposition: A | Discharge status: Home / Self Care | Payer: Self-pay | Source: Ambulatory Visit | Attending: Gastroenterology

## 2021-05-03 DIAGNOSIS — F17210 Nicotine dependence, cigarettes, uncomplicated: Secondary | ICD-10-CM | POA: Insufficient documentation

## 2021-05-03 DIAGNOSIS — K641 Second degree hemorrhoids: Secondary | ICD-10-CM | POA: Insufficient documentation

## 2021-05-03 DIAGNOSIS — Z1211 Encounter for screening for malignant neoplasm of colon: Secondary | ICD-10-CM | POA: Insufficient documentation

## 2021-05-03 HISTORY — PX: COLONOSCOPY WITH PROPOFOL: SHX5780

## 2021-05-03 SURGERY — COLONOSCOPY WITH PROPOFOL
Anesthesia: General

## 2021-05-03 MED ORDER — PROPOFOL 10 MG/ML IV BOLUS
INTRAVENOUS | Status: DC | PRN
  Administered 2021-05-03: 70 mg via INTRAVENOUS

## 2021-05-03 MED ORDER — PROPOFOL 500 MG/50ML IV EMUL
INTRAVENOUS | Status: DC | PRN
  Administered 2021-05-03: 140 ug/kg/min via INTRAVENOUS

## 2021-05-03 MED ORDER — SODIUM CHLORIDE 0.9 % IV SOLN
INTRAVENOUS | Status: DC
  Administered 2021-05-03: 1000 mL via INTRAVENOUS

## 2021-05-03 MED ORDER — PHENYLEPHRINE HCL (PRESSORS) 10 MG/ML IV SOLN
INTRAVENOUS | Status: DC | PRN
  Administered 2021-05-03: 100 ug via INTRAVENOUS

## 2021-05-03 NOTE — H&P (Signed)
   Eddie Lame, MD [address] [place], [place] [address] Phone: [phone] Fax : [phone]  Primary Care Physician:  Elba Barman, MD Primary Gastroenterologist:  Dr. Allen Norris  Pre-Procedure History & Physical: HPI:  [name] is a 45 y.o. male is here for a screening colonoscopy.   History reviewed. No pertinent past medical history.  Past Surgical History:  Procedure Laterality Date  . JOINT REPLACEMENT      Prior to Admission medications   Medication Sig Start Date End Date Taking? Authorizing Provider  INVEGA TRINZA 546 MG/1.75ML SUSY Inject into the muscle. 03/14/21   [provider]  Na Sulfate-K Sulfate-Mg Sulf (SUPREP BOWEL PREP KIT) 17.5-3.13-1.6 GM/177ML SOLN Take 1 kit by mouth as directed. 04/18/21   Eddie Lame, MD    Allergies as of 04/19/2021  . (No Known Allergies)    History reviewed. No pertinent family history.  Social History   Socioeconomic History  . Marital status: Single    Spouse name: Not on file  . Number of children: Not on file  . Years of education: Not on file  . Highest education level: Not on file  Occupational History  . Not on file  Tobacco Use  . Smoking status: Current Every Day Smoker    Packs/day: 1.00    Types: Cigarettes  . Smokeless tobacco: Never Used  Substance and Sexual Activity  . Alcohol use: Never  . Drug use: Not on file  . Sexual activity: Not on file  Other Topics Concern  . Not on file  Social History Narrative  . Not on file   Social Determinants of Health   Financial Resource Strain: Not on file  Food Insecurity: Not on file  Transportation Needs: Not on file  Physical Activity: Not on file  Stress: Not on file  Social Connections: Not on file  Intimate Partner Violence: Not on file    Review of Systems: See HPI, otherwise negative ROS  Physical Exam: BP 129/84   Pulse 89   Temp (!) 96.6 F (35.9 C) (Temporal)   Resp 17   Ht _0  (1.88 m)   Wt 90.3 kg    SpO2 98%   BMI 25.55 kg/m  General:   Alert,  pleasant and cooperative in NAD Head:  Normocephalic and atraumatic. Neck:  Supple; no masses or thyromegaly. Lungs:  Clear throughout to auscultation.    Heart:  Regular rate and rhythm. Abdomen:  Soft, nontender and nondistended. Normal bowel sounds, without guarding, and without rebound.   Neurologic:  Alert and  oriented x4;  grossly normal neurologically.  Impression/Plan: [name] is now here to undergo a screening colonoscopy.  Risks, benefits, and alternatives regarding colonoscopy have been reviewed with the patient.  Questions have been answered.  All parties agreeable.

## 2021-05-03 NOTE — Op Note (Addendum)
[facility] Gastroenterology Patient Name: [name] Procedure Date: 05/03/2021 9:20 AM MRN: [id number] Account #: [id number] Date of Birth: Nov 29, 1976 Admit Type: Outpatient Age: 45 Room: [facility] ENDO ROOM 4 Gender: Male Note Status: Finalized Procedure:             Colonoscopy Indications:           Screening for colorectal malignant neoplasm Providers:             Midge Minium MD, MD Referring MD:          Ezekiel Slocumb, MD (Referring MD) Medicines:             Propofol per Anesthesia Complications:         No immediate complications. Procedure:             Pre-Anesthesia Assessment:                        - Prior to the procedure, a History and Physical was                         performed, and patient medications and allergies were                         reviewed. The patient's tolerance of previous                         anesthesia was also reviewed. The risks and benefits                         of the procedure and the sedation options and risks                         were discussed with the patient. All questions were                         answered, and informed consent was obtained. Prior                         Anticoagulants: The patient has taken no previous                         anticoagulant or antiplatelet agents. ASA Grade                         Assessment: II - A patient with mild systemic disease.                         After reviewing the risks and benefits, the patient                         was deemed in satisfactory condition to undergo the                         procedure.                        After obtaining informed consent, the colonoscope was  passed under direct vision. Throughout the procedure,                         the patient's blood pressure, pulse, and oxygen                         saturations were monitored continuously. The                         Colonoscope was introduced through the anus  and                         advanced to the the cecum, identified by appendiceal                         orifice and ileocecal valve. The colonoscopy was                         performed without difficulty. The patient tolerated                         the procedure well. The quality of the bowel                         preparation was excellent. Findings:      The perianal and digital rectal examinations were normal.      Non-bleeding internal hemorrhoids were found during retroflexion. The       hemorrhoids were Grade II (internal hemorrhoids that prolapse but reduce       spontaneously). Impression:            - Non-bleeding internal hemorrhoids.                        - No specimens collected. Recommendation:        - Discharge patient to home.                        - Resume previous diet.                        - Continue present medications.                        - Repeat colonoscopy in 10 years for screening                         purposes. Procedure Code(s):     --- Professional ---                        [phone], Colonoscopy, flexible; diagnostic, including                         collection of specimen(s) by brushing or washing, when                         performed (separate procedure) Diagnosis Code(s):     --- Professional ---                        Z12.11, Encounter for screening for  malignant neoplasm                         of colon CPT copyright 2019 American Medical Association. All rights reserved. The codes documented in this report are preliminary and upon coder review may  be revised to meet current compliance requirements. Midge Minium MD, MD 05/03/2021 9:45:17 AM This report has been signed electronically. Number of Addenda: 0 Note Initiated On: 05/03/2021 9:20 AM Scope Withdrawal Time: 0 hours 6 minutes 45 seconds  Total Procedure Duration: 0 hours 11 minutes 3 seconds  Estimated Blood Loss:  Estimated blood loss: none.      [facility]

## 2021-05-03 NOTE — Anesthesia Preprocedure Evaluation (Signed)
Anesthesia Evaluation  Patient identified by MRN, date of birth, ID band Patient awake    Reviewed: Allergy & Precautions, NPO status , Patient's Chart, lab work & pertinent test results  History of Anesthesia Complications Negative for: history of anesthetic complications  Airway Mallampati: III  TM Distance: >3 FB Neck ROM: full    Dental  (+) Missing   Pulmonary neg shortness of breath, Current Smoker and Patient abstained from smoking.,    Pulmonary exam normal        Cardiovascular Exercise Tolerance: Good (-) angina(-) Past MI negative cardio ROS Normal cardiovascular exam     Neuro/Psych negative neurological ROS  negative psych ROS   GI/Hepatic negative GI ROS, Neg liver ROS, neg GERD  ,  Endo/Other  negative endocrine ROS  Renal/GU negative Renal ROS  negative genitourinary   Musculoskeletal   Abdominal   Peds  Hematology negative hematology ROS (+)   Anesthesia Other Findings History reviewed. No pertinent past medical history.  Past Surgical History: No date: JOINT REPLACEMENT  BMI    Body Mass Index: 25.55 kg/m      Reproductive/Obstetrics negative OB ROS                             Anesthesia Physical Anesthesia Plan  ASA: II  Anesthesia Plan: General   Post-op Pain Management:    Induction: Intravenous  PONV Risk Score and Plan: Propofol infusion and TIVA  Airway Management Planned: Natural Airway and Nasal Cannula  Additional Equipment:   Intra-op Plan:   Post-operative Plan:   Informed Consent: I have reviewed the patients History and Physical, chart, labs and discussed the procedure including the risks, benefits and alternatives for the proposed anesthesia with the patient or authorized representative who has indicated his/her understanding and acceptance.     Dental Advisory Given  Plan Discussed with: Anesthesiologist, CRNA and  Surgeon  Anesthesia Plan Comments: (Patient consented for risks of anesthesia including but not limited to:  - adverse reactions to medications - risk of airway placement if required - damage to eyes, teeth, lips or other oral mucosa - nerve damage due to positioning  - sore throat or hoarseness - Damage to heart, brain, nerves, lungs, other parts of body or loss of life  Patient voiced understanding.)        Anesthesia Quick Evaluation

## 2021-05-03 NOTE — Transfer of Care (Signed)
Immediate Anesthesia Transfer of Care Note  Patient: [name]  Procedure(s) Performed: COLONOSCOPY WITH PROPOFOL (N/A )  Patient Location: PACU  Anesthesia Type:General  Level of Consciousness: sedated  Airway & Oxygen Therapy: Patient Spontanous Breathing  Post-op Assessment: Report given to RN and Post -op Vital signs reviewed and stable  Post vital signs: Reviewed and stable  Last Vitals:  Vitals Value Taken Time  BP 106/62 05/03/21 0947  Temp 36.5 C 05/03/21 0945  Pulse 81 05/03/21 0947  Resp 18 05/03/21 0947  SpO2 100 % 05/03/21 0947  Vitals shown include unvalidated device data.  Last Pain:  Vitals:   05/03/21 0945  TempSrc: Temporal  PainSc: Asleep         Complications: No complications documented.

## 2021-05-03 NOTE — Anesthesia Postprocedure Evaluation (Signed)
Anesthesia Post Note  Patient: [name]  Procedure(s) Performed: COLONOSCOPY WITH PROPOFOL (N/A )  Patient location during evaluation: Endoscopy Anesthesia Type: General Level of consciousness: awake and alert Pain management: pain level controlled Vital Signs Assessment: post-procedure vital signs reviewed and stable Respiratory status: spontaneous breathing, nonlabored ventilation, respiratory function stable and patient connected to nasal cannula oxygen Cardiovascular status: blood pressure returned to baseline and stable Postop Assessment: no apparent nausea or vomiting Anesthetic complications: no   No complications documented.   Last Vitals:  Vitals:   05/03/21 0948 05/03/21 1005  BP: 106/62 117/83  Pulse:    Resp:    Temp:    SpO2:      Last Pain:  Vitals:   05/03/21 1015  TempSrc:   PainSc: 0-No pain                 Cleda Mccreedy Neriyah Cercone

## 2021-05-04 ENCOUNTER — Encounter: Payer: Self-pay | Admitting: Gastroenterology

## 2022-01-01 ENCOUNTER — Emergency Department
Admission: EM | Admit: 2022-01-01 | Discharge: 2022-01-01 | Disposition: A | Discharge status: Home / Self Care | Payer: Medicare Other | Attending: Student in an Organized Health Care Education/Training Program | Admitting: Student in an Organized Health Care Education/Training Program

## 2022-01-01 ENCOUNTER — Other Ambulatory Visit: Payer: Self-pay

## 2022-01-01 ENCOUNTER — Encounter: Payer: Self-pay | Admitting: Psychiatry

## 2022-01-01 DIAGNOSIS — U071 COVID-19: Secondary | ICD-10-CM | POA: Insufficient documentation

## 2022-01-01 DIAGNOSIS — R45851 Suicidal ideations: Secondary | ICD-10-CM | POA: Insufficient documentation

## 2022-01-01 DIAGNOSIS — F79 Unspecified intellectual disabilities: Secondary | ICD-10-CM | POA: Insufficient documentation

## 2022-01-01 LAB — RESP PANEL BY RT-PCR (FLU A&B, COVID) ARPGX2
Influenza A by PCR: NEGATIVE
Influenza B by PCR: NEGATIVE
SARS Coronavirus 2 by RT PCR: POSITIVE — AB

## 2022-01-01 LAB — COMPREHENSIVE METABOLIC PANEL
ALT: 40 U/L (ref 0–44)
AST: 40 U/L (ref 15–41)
Albumin: 4.1 g/dL (ref 3.5–5.0)
Alkaline Phosphatase: 189 U/L — ABNORMAL HIGH (ref 38–126)
Anion gap: 6 (ref 5–15)
BUN: 8 mg/dL (ref 6–20)
CO2: 30 mmol/L (ref 22–32)
Calcium: 9.3 mg/dL (ref 8.9–10.3)
Chloride: 103 mmol/L (ref 98–111)
Creatinine, Ser: 0.85 mg/dL (ref 0.61–1.24)
GFR, Estimated: >60 mL/min (ref >60)
Glucose, Bld: 161 mg/dL — ABNORMAL HIGH (ref 70–99)
Potassium: 3.7 mmol/L (ref 3.5–5.1)
Sodium: 139 mmol/L (ref 135–145)
Total Bilirubin: 0.6 mg/dL (ref 0.3–1.2)
Total Protein: 7.2 g/dL (ref 6.5–8.1)

## 2022-01-01 LAB — CBC
HCT: 46.2 % (ref 39.0–52.0)
Hemoglobin: 15.3 g/dL (ref 13.0–17.0)
MCH: 30.7 pg (ref 26.0–34.0)
MCHC: 33.1 g/dL (ref 30.0–36.0)
MCV: 92.6 fL (ref 80.0–100.0)
Platelets: 133 10*3/uL — ABNORMAL LOW (ref 150–400)
RBC: 4.99 MIL/uL (ref 4.22–5.81)
RDW: 12.9 % (ref 11.5–15.5)
WBC: 3.1 10*3/uL — ABNORMAL LOW (ref 4.0–10.5)
nRBC: 0 % (ref 0.0–0.2)

## 2022-01-01 LAB — URINE DRUG SCREEN, QUALITATIVE (ARMC ONLY)
Amphetamines, Ur Screen: NOT DETECTED
Barbiturates, Ur Screen: NOT DETECTED
Benzodiazepine, Ur Scrn: NOT DETECTED
Cannabinoid 50 Ng, Ur ~~LOC~~: NOT DETECTED
Cocaine Metabolite,Ur ~~LOC~~: NOT DETECTED
MDMA (Ecstasy)Ur Screen: NOT DETECTED
Methadone Scn, Ur: NOT DETECTED
Opiate, Ur Screen: NOT DETECTED
Phencyclidine (PCP) Ur S: NOT DETECTED
Tricyclic, Ur Screen: NOT DETECTED

## 2022-01-01 LAB — ETHANOL: Alcohol, Ethyl (B): <10 mg/dL (ref <10)

## 2022-01-01 LAB — SALICYLATE LEVEL: Salicylate Lvl: <7 mg/dL — ABNORMAL LOW (ref 7.0–30.0)

## 2022-01-01 LAB — ACETAMINOPHEN LEVEL: Acetaminophen (Tylenol), Serum: <10 ug/mL — ABNORMAL LOW (ref 10–30)

## 2022-01-01 NOTE — ED Provider Notes (Signed)
The patient has been evaluated at bedside bypsychiatry.  Patient is clinically stable.  Not felt to be a danger to self or others.  No SI or Hi.  No indication for inpatient psychiatric admission at this time.  Appropriate for continued outpatient therapy.    Merlyn Lot, MD 01/01/22 1241

## 2022-01-01 NOTE — Consult Note (Signed)
[facility] Consult   Reason for Consult:  feeling unhappy Referring Physician:  Quentin Cornwall Patient Identification: [name] MRN:  [id number] Principal Diagnosis: Intellectual disability Diagnosis:  Principal Problem:   Intellectual disability   Total Time spent with patient: 1 hour  Subjective:   [name] is a 46 y.o. male patient admitted with "I wanted to get my check yesterday. I feel like a big man with money in my pocket."  HPI:  Patient seen and chart reviewed. Patient has some trouble with enunciation of words, but with careful attention, writer can be understood. 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 He talks about some people "picking on him" at the day program sometimes, but in general he is happy there. He says he works there and gets  a check. He was supposed to get his check yesterday, and the group home owner was not able to bring him to get it. He states that is why he told his roommate that he "thought it was better if he wasn't alive"; he likes to make people happy and he thought he wasn't making the owner unhappy. Patient states that he feels good today, no thoughts of suicide or self-harm. He denies HI/AVH. Does not appear to be responding to internal stimuli. Is pleasant, cooperative. Writer offered support and encouragement. Patient does not meet criteria for inpatient psychiatric hospitalization.  Collateral from group home owner, in person at hospital. He states that the comment patient made to his roommate was surprising. He corroborated  the fact about paycheck and stated that  he was busy with another resident at the time and it is sometimes difficult to do what patient wants as soon as he wants it. Owner states that he is comfortable taking patient home and does not have concerns about him not remaining safe.   Past Psychiatric History: none; IDD  Risk to Self:   Risk to Others:   Prior  Inpatient Therapy:   Prior Outpatient Therapy:    Past Medical History:  Past Medical History:  Diagnosis Date   Intellectual disability 01/01/2022    Past Surgical History:  Procedure Laterality Date   COLONOSCOPY WITH PROPOFOL N/A 05/03/2021   Procedure: COLONOSCOPY WITH PROPOFOL;  Surgeon: Lucilla Lame, MD;  [facility] ENDOSCOPY;  Service: Endoscopy;  Laterality: N/A;   JOINT REPLACEMENT     Family History: No family history on file. Family Psychiatric  History: unknown Social History:  Social History   Substance and Sexual Activity  Alcohol Use Never     Social History   Substance and Sexual Activity  Drug Use Not on file    Social History   Socioeconomic History   Marital status: Single    Spouse name: Not on file   Number of children: Not on file   Years of education: Not on file   Highest education level: Not on file  Occupational History   Not on file  Tobacco Use   Smoking status: Every Day    Packs/day: 1.00    Types: Cigarettes   Smokeless tobacco: Never  Substance and Sexual Activity   Alcohol use: Never   Drug use: Not on file   Sexual activity: Not on file  Other Topics Concern   Not on file  Social History Narrative   Not on file   Social Determinants of Health   Financial Resource Strain: Not on file  Food Insecurity: Not on  file  Transportation Needs: Not on file  Physical Activity: Not on file  Stress: Not on file  Social Connections: Not on file   Additional Social History:    Allergies:  No Known Allergies  Labs:  Results for orders placed or performed during the hospital encounter of 01/01/22 (from the past 48 hour(s))  Comprehensive metabolic panel     Status: Abnormal   Collection Time: 01/01/22  8:29 AM  Result Value Ref Range   Sodium 139 135 - 145 mmol/L   Potassium 3.7 3.5 - 5.1 mmol/L   Chloride 103 98 - 111 mmol/L   CO2 30 22 - 32 mmol/L   Glucose, Bld 161 (H) 70 - 99 mg/dL    Comment: Glucose reference range  applies only to samples taken after fasting for at least 8 hours.   BUN 8 6 - 20 mg/dL   Creatinine, Ser 0.85 0.61 - 1.24 mg/dL   Calcium 9.3 8.9 - 10.3 mg/dL   Total Protein 7.2 6.5 - 8.1 g/dL   Albumin 4.1 3.5 - 5.0 g/dL   AST 40 15 - 41 U/L   ALT 40 0 - 44 U/L   Alkaline Phosphatase 189 (H) 38 - 126 U/L   Total Bilirubin 0.6 0.3 - 1.2 mg/dL   GFR, Estimated >60 >60 mL/min    Comment: (NOTE) Calculated using the CKD-EPI Creatinine Equation (2021)    Anion gap 6 5 - 15    Comment: Performed at [facility], [address] [place], [place] [address]  Ethanol     Status: None   Collection Time: 01/01/22  8:29 AM  Result Value Ref Range   Alcohol, Ethyl (B) <10 <10 mg/dL    Comment: (NOTE) Lowest detectable limit for serum alcohol is 10 mg/dL.  For medical purposes only. Performed at [facility], [address] [place], [place] [address]   Salicylate level     Status: Abnormal   Collection Time: 01/01/22  8:29 AM  Result Value Ref Range   Salicylate Lvl <9.2 (L) 7.0 - 30.0 mg/dL    Comment: Performed at [facility], [address] [place], [place] [address]  Acetaminophen level     Status: Abnormal   Collection Time: 01/01/22  8:29 AM  Result Value Ref Range   Acetaminophen (Tylenol), Serum <10 (L) 10 - 30 ug/mL    Comment: (NOTE) Therapeutic concentrations vary significantly. A range of 10-30 ug/mL  may be an effective concentration for many patients. However, some  are best treated at concentrations outside of this range. Acetaminophen concentrations >150 ug/mL at 4 hours after ingestion  and >50 ug/mL at 12 hours after ingestion are often associated with  toxic reactions.  Performed at [facility], [address] [place], [place] [address]   cbc     Status: Abnormal   Collection Time: 01/01/22  8:29 AM  Result Value Ref Range   WBC 3.1 (L) 4.0 - 10.5 K/uL   RBC 4.99 4.22 - 5.81 MIL/uL   Hemoglobin 15.3 13.0 - 17.0  g/dL   HCT 46.2 39.0 - 52.0 %   MCV 92.6 80.0 - 100.0 fL   MCH 30.7 26.0 - 34.0 pg   MCHC 33.1 30.0 - 36.0 g/dL   RDW 12.9 11.5 - 15.5 %   Platelets 133 (L) 150 - 400 K/uL   nRBC 0.0 0.0 - 0.2 %    Comment: Performed at [facility], [address] [place], [place] [address]  Urine Drug Screen, Qualitative     Status: None   Collection Time: 01/01/22  8:29 AM  Result Value Ref Range   Tricyclic, Ur Screen NONE DETECTED NONE DETECTED   Amphetamines, Ur Screen NONE DETECTED NONE DETECTED   MDMA (Ecstasy)Ur Screen NONE DETECTED NONE DETECTED   Cocaine Metabolite,Ur [place] NONE DETECTED NONE DETECTED   Opiate, Ur Screen NONE DETECTED NONE DETECTED   Phencyclidine (PCP) Ur S NONE DETECTED NONE DETECTED   Cannabinoid 50 Ng, Ur [place] NONE DETECTED NONE DETECTED   Barbiturates, Ur Screen NONE DETECTED NONE DETECTED   Benzodiazepine, Ur Scrn NONE DETECTED NONE DETECTED   Methadone Scn, Ur NONE DETECTED NONE DETECTED    Comment: (NOTE) Tricyclics + metabolites, urine    Cutoff 1000 ng/mL Amphetamines + metabolites, urine  Cutoff 1000 ng/mL MDMA (Ecstasy), urine              Cutoff 500 ng/mL Cocaine Metabolite, urine          Cutoff 300 ng/mL Opiate + metabolites, urine        Cutoff 300 ng/mL Phencyclidine (PCP), urine         Cutoff 25 ng/mL Cannabinoid, urine                 Cutoff 50 ng/mL Barbiturates + metabolites, urine  Cutoff 200 ng/mL Benzodiazepine, urine              Cutoff 200 ng/mL Methadone, urine                   Cutoff 300 ng/mL  The urine drug screen provides only a preliminary, unconfirmed analytical test result and should not be used for non-medical purposes. Clinical consideration and professional judgment should be applied to any positive drug screen result due to possible interfering substances. A more specific alternate chemical method must be used in order to obtain a confirmed analytical result. Gas chromatography / mass spectrometry (GC/MS) is the  preferred confirm atory method. Performed at [facility], [address] [place], [place] [address]   Resp Panel by RT-PCR (Flu A&B, Covid) Nasopharyngeal Swab     Status: Abnormal   Collection Time: 01/01/22  8:31 AM   Specimen: Nasopharyngeal Swab; Nasopharyngeal(NP) swabs in vial transport medium  Result Value Ref Range   SARS Coronavirus 2 by RT PCR POSITIVE (A) NEGATIVE    Comment: (NOTE) SARS-CoV-2 target nucleic acids are DETECTED.  The SARS-CoV-2 RNA is generally detectable in upper respiratory specimens during the acute phase of infection. Positive results are indicative of the presence of the identified virus, but do not rule out bacterial infection or co-infection with other pathogens not detected by the test. Clinical correlation with patient history and other diagnostic information is necessary to determine patient infection status. The expected result is Negative.  Fact Sheet for Patients: EntrepreneurPulse.com.au  Fact Sheet for Healthcare Providers: IncredibleEmployment.be  This test is not yet approved or cleared by the [place] FDA and  has been authorized for detection and/or diagnosis of SARS-CoV-2 by FDA under an Emergency Use Authorization (EUA).  This EUA will remain in effect (meaning this test can be used) for the duration of  the COVID-19 declaration under Section 564(b)(1) of the A ct, 21 U.S.C. section 360bbb-3(b)(1), unless the authorization is terminated or revoked sooner.     Influenza A by PCR NEGATIVE NEGATIVE   Influenza B by PCR NEGATIVE NEGATIVE    Comment: (NOTE) The Xpert Xpress SARS-CoV-2/FLU/RSV plus assay is intended as  an aid in the diagnosis of influenza from Nasopharyngeal swab specimens and should not be used as a sole basis for treatment. Nasal washings and aspirates are unacceptable for Xpert Xpress SARS-CoV-2/FLU/RSV testing.  Fact Sheet for  Patients: EntrepreneurPulse.com.au  Fact Sheet for Healthcare Providers: IncredibleEmployment.be  This test is not yet approved or cleared by the [place] FDA and has been authorized for detection and/or diagnosis of SARS-CoV-2 by FDA under an Emergency Use Authorization (EUA). This EUA will remain in effect (meaning this test can be used) for the duration of the COVID-19 declaration under Section 564(b)(1) of the Act, 21 U.S.C. section 360bbb-3(b)(1), unless the authorization is terminated or revoked.  Performed at [facility], [address] [place], [place] [address]     No current facility-administered medications for this encounter.   Current Outpatient Medications  Medication Sig Dispense Refill   INVEGA TRINZA 546 MG/1.75ML SUSY Inject into the muscle.     Na Sulfate-K Sulfate-Mg Sulf (SUPREP BOWEL PREP KIT) 17.5-3.13-1.6 GM/177ML SOLN Take 1 kit by mouth as directed. 354 mL 0    Musculoskeletal: Strength & Muscle Tone: within normal limits Gait & Station: normal Patient leans: N/A   Psychiatric Specialty Exam:  Presentation  General Appearance: Appropriate for Environment  Eye Contact:Good  Speech:Slow (Speech impeded)  Speech Volume:Normal  Handedness:No data recorded  Mood and Affect  Mood:Euthymic  Affect:Appropriate   Thought Process  Thought Processes:Coherent  Descriptions of Associations:Intact  Orientation:Full (Time, Place and Person)  Thought Content:Logical; WDL  History of Schizophrenia/Schizoaffective disorder:No data recorded Duration of Psychotic Symptoms:No data recorded Hallucinations:Hallucinations: None  Ideas of Reference:None  Suicidal Thoughts:Suicidal Thoughts: No  Homicidal Thoughts:Homicidal Thoughts: No   Sensorium  Memory:Immediate Good  Judgment:Fair  Insight:Fair   Executive Functions  Concentration:Fair  Attention Span:Good  [place]  of Knowledge:Poor  Language:Fair   Psychomotor Activity  Psychomotor Activity:Psychomotor Activity: Normal   Assets  Assets:Housing; Catering manager; Physical Health; Resilience; Social Support   Sleep  Sleep:Sleep: Good   Physical Exam: Physical Exam Vitals and nursing note reviewed.  HENT:     Head: Normocephalic.     Nose: No congestion or rhinorrhea.  Eyes:     General:        Right eye: No discharge.        Left eye: No discharge.  Pulmonary:     Effort: Pulmonary effort is normal.  Musculoskeletal:        General: Normal range of motion.     Cervical back: Normal range of motion.  Neurological:     Mental Status: He is alert and oriented to person, place, and time.  Psychiatric:        Attention and Perception: Attention normal.        Mood and Affect: Mood normal.        Behavior: Behavior normal.        Thought Content: Thought content is not paranoid. Thought content does not include homicidal or suicidal ideation.        Cognition and Memory: Cognition is impaired.     Comments: IDD; speech impediment   ROS Blood pressure (!) 148/104, pulse 86, temperature 98.8 F (37.1 C), temperature source Oral, resp. rate 18, height 6' 2"  (1.88 m), weight 90.3 kg, SpO2 98 %. Body mass index is 25.56 kg/m.  Treatment Plan Summary: Plan 46 year old male with IDD presents from group home with thoughts of suicide in context of not being able to get his pay check yesterday. Patient  states he is "feeling fine" right now, is calm and cooperative. Denies any thoughts of self-harm/SI/HI/AVH. Reviewed with EDP.    Disposition: No evidence of imminent risk to self or others at present.   Supportive therapy provided about ongoing stressors. Discussed crisis plan, support from social network, calling 911, coming to the Emergency Department, and calling Suicide Hotline.  Sherlon Handing, NP 01/01/2022 1:06 PM
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
## 2022-01-01 NOTE — ED Notes (Signed)
All pt belongings returned to pt.  Group home rep came to p/u pt via private transport.  All belongings returned to pt including cell phone and clothing.  Discharge instruction reviewed with pt and group home rep.  Both denied any questions.
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
## 2022-01-01 NOTE — ED Notes (Signed)
Pt belongings:  1 red shirt 1 pair of blue jeans with brown belt 1 neon hoodie 1 black coat 1 white t shirt 1 pair of black sneakers 1 cell phone 1 red lighter keychain 1 pair of white socks 1 pair of red and blue plaid boxers 1 black wallet

## 2022-01-01 NOTE — ED Notes (Signed)
Gave lunch tray with water and some wipes to clean himself up. °

## 2024-01-26 ENCOUNTER — Emergency Department
Admission: EM | Admit: 2024-01-26 | Discharge: 2024-01-28 | Disposition: A | Discharge status: Home / Self Care | Payer: Medicare HMO | Attending: Emergency Medicine | Admitting: Emergency Medicine

## 2024-01-26 ENCOUNTER — Other Ambulatory Visit: Payer: Self-pay

## 2024-01-26 DIAGNOSIS — F79 Unspecified intellectual disabilities: Secondary | ICD-10-CM | POA: Insufficient documentation

## 2024-01-26 DIAGNOSIS — F259 Schizoaffective disorder, unspecified: Secondary | ICD-10-CM | POA: Diagnosis not present

## 2024-01-26 DIAGNOSIS — R4183 Borderline intellectual functioning: Secondary | ICD-10-CM | POA: Diagnosis not present

## 2024-01-26 DIAGNOSIS — R45850 Homicidal ideations: Secondary | ICD-10-CM | POA: Insufficient documentation

## 2024-01-26 DIAGNOSIS — Z79899 Other long term (current) drug therapy: Secondary | ICD-10-CM | POA: Insufficient documentation

## 2024-01-26 HISTORY — DX: Bipolar disorder, unspecified: F31.9

## 2024-01-26 HISTORY — DX: Schizoaffective disorder, unspecified: F25.9

## 2024-01-26 LAB — COMPREHENSIVE METABOLIC PANEL
ALT: 66 U/L — ABNORMAL HIGH (ref 0–44)
AST: 45 U/L — ABNORMAL HIGH (ref 15–41)
Albumin: 4.2 g/dL (ref 3.5–5.0)
Alkaline Phosphatase: 289 U/L — ABNORMAL HIGH (ref 38–126)
Anion gap: 7 (ref 5–15)
BUN: 9 mg/dL (ref 6–20)
CO2: 27 mmol/L (ref 22–32)
Calcium: 9.5 mg/dL (ref 8.9–10.3)
Chloride: 106 mmol/L (ref 98–111)
Creatinine, Ser: 0.91 mg/dL (ref 0.61–1.24)
GFR, Estimated: >60 mL/min (ref >60)
Glucose, Bld: 141 mg/dL — ABNORMAL HIGH (ref 70–99)
Potassium: 4 mmol/L (ref 3.5–5.1)
Sodium: 140 mmol/L (ref 135–145)
Total Bilirubin: 0.9 mg/dL (ref 0.0–1.2)
Total Protein: 7.2 g/dL (ref 6.5–8.1)

## 2024-01-26 LAB — CBC
HCT: 44 % (ref 39.0–52.0)
Hemoglobin: 14.6 g/dL (ref 13.0–17.0)
MCH: 31.1 pg (ref 26.0–34.0)
MCHC: 33.2 g/dL (ref 30.0–36.0)
MCV: 93.6 fL (ref 80.0–100.0)
Platelets: 167 10*3/uL (ref 150–400)
RBC: 4.7 MIL/uL (ref 4.22–5.81)
RDW: 13.3 % (ref 11.5–15.5)
WBC: 5.6 10*3/uL (ref 4.0–10.5)
nRBC: 0 % (ref 0.0–0.2)

## 2024-01-26 LAB — SALICYLATE LEVEL: Salicylate Lvl: <7 mg/dL — ABNORMAL LOW (ref 7.0–30.0)

## 2024-01-26 LAB — URINE DRUG SCREEN, QUALITATIVE (ARMC ONLY)
Amphetamines, Ur Screen: NOT DETECTED
Barbiturates, Ur Screen: NOT DETECTED
Benzodiazepine, Ur Scrn: NOT DETECTED
Cannabinoid 50 Ng, Ur ~~LOC~~: NOT DETECTED
Cocaine Metabolite,Ur ~~LOC~~: NOT DETECTED
MDMA (Ecstasy)Ur Screen: NOT DETECTED
Methadone Scn, Ur: NOT DETECTED
Opiate, Ur Screen: NOT DETECTED
Phencyclidine (PCP) Ur S: NOT DETECTED
Tricyclic, Ur Screen: NOT DETECTED

## 2024-01-26 LAB — ETHANOL: Alcohol, Ethyl (B): <10 mg/dL (ref <10)

## 2024-01-26 LAB — ACETAMINOPHEN LEVEL: Acetaminophen (Tylenol), Serum: <10 ug/mL — ABNORMAL LOW (ref 10–30)
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
## 2024-01-26 NOTE — ED Provider Notes (Signed)
 [facility] Provider Note    Event Date/Time   First MD Initiated Contact with Patient 01/26/24 1700     (approximate)   History   Homicidal   HPI [name] is a 48 y.o. male with history of intellectual disability presenting today for homicidal ideation.  Patient states currently living at a group home and he has had worsening thoughts recently of wanting to hurt others.  He came to the ED because he wants to prevent that from happening.  He currently denies SI at this time.  Denies any drug use.  Denies any hallucinations.  Otherwise calm and cooperative with no acute illnesses recently.     Physical Exam   Triage Vital Signs: ED Triage Vitals  Encounter Vitals Group     BP 01/26/24 1554 (!) 139/92     Systolic BP Percentile --      Diastolic BP Percentile --      Pulse Rate 01/26/24 1554 60     Resp 01/26/24 1554 16     Temp 01/26/24 1554 98.7 F (37.1 C)     Temp Source 01/26/24 1554 Oral     SpO2 01/26/24 1554 100 %     Weight 01/26/24 1557 155 lb (70.3 kg)     Height 01/26/24 1557 6\' 2"  (1.88 m)     Head Circumference --      Peak Flow --      Pain Score 01/26/24 1557 0     Pain Loc --      Pain Education --      Exclude from Growth Chart --     Most recent vital signs: Vitals:   01/26/24 1554  BP: (!) 139/92  Pulse: 60  Resp: 16  Temp: 98.7 F (37.1 C)  SpO2: 100%   I have reviewed the vital signs. General:  Awake, alert, no acute distress. Head:  Normocephalic, Atraumatic. EENT:  PERRL, EOMI, Oral mucosa pink and moist, Neck is supple. Cardiovascular: Regular rate, 2+ distal pulses. Respiratory:  Normal respiratory effort, symmetrical expansion, no distress.   Extremities:  Moving all four extremities through full ROM without pain.   Neuro:  Alert and oriented.  Interacting appropriately.   Skin:  Warm, dry, no rash.   Psych: Appropriate affect.    ED Results / Procedures / Treatments   Labs (all labs ordered  are listed, but only abnormal results are displayed) Labs Reviewed  SALICYLATE LEVEL - Abnormal; Notable for the following components:      Result Value   Salicylate Lvl <7.0 (*)    All other components within normal limits  ACETAMINOPHEN LEVEL - Abnormal; Notable for the following components:   Acetaminophen (Tylenol), Serum <10 (*)    All other components within normal limits  COMPREHENSIVE METABOLIC PANEL - Abnormal; Notable for the following components:   Glucose, Bld 141 (*)    AST 45 (*)    ALT 66 (*)    Alkaline Phosphatase 289 (*)    All other components within normal limits  ETHANOL  URINE DRUG SCREEN, QUALITATIVE ([facility] ONLY)  CBC     EKG    RADIOLOGY    PROCEDURES:  Critical Care performed: No  Procedures   MEDICATIONS ORDERED IN ED: Medications - No data to display   IMPRESSION / MDM / ASSESSMENT AND PLAN / ED COURSE  I reviewed the triage vital signs and the nursing notes.  Differential diagnosis includes, but is not limited to, homicidal ideation  Patient's presentation is most consistent with acute presentation with potential threat to life or bodily function.  Patient is a 48 year old male with intellectual disability presenting today for homicidal thoughts.  Otherwise no acute distress and no medical complaints.  Denies SI.  Medically cleared and psychiatry consulted for further evaluation.  Patient signed out at end of shift pending psychiatric evaluation.  The patient has been placed in psychiatric observation due to the need to provide a safe environment for the patient while obtaining psychiatric consultation and evaluation, as well as ongoing medical and medication management to treat the patient's condition.  The patient has not been placed under full IVC at this time.     FINAL CLINICAL IMPRESSION(S) / ED DIAGNOSES   Final diagnoses:  Homicidal ideation     Rx / DC Orders   ED Discharge Orders      None        Note:  This document was prepared using Dragon voice recognition software and may include unintentional dictation errors.   Janith Lima, MD 01/26/24 [phone]

## 2024-01-26 NOTE — ED Notes (Addendum)
 Pt dressed out into hospital attire. Pt belongings to include: 1 yellow sweatshirt 1 red shirt 1 green white stripe shirt 1 black pants 2 gray necklaces 1 black cellphone 2 black shoes 1 white shirt 1 red belt 1 black underwear 2 white socks 1 yellow black wallet

## 2024-01-27 NOTE — BH Assessment (Signed)
 Writer contacted [name] to inquire about admission. Patient was declined due to IDD dx.

## 2024-01-27 NOTE — ED Notes (Signed)
 Hospital meal provided.  100% consumed, pt tolerated w/o complaints.  Waste discarded appropriately.

## 2024-01-27 NOTE — ED Notes (Signed)
Breakfast tray provided to pt.

## 2024-01-27 NOTE — ED Notes (Signed)
 Dinner tray provided for pt

## 2024-01-27 NOTE — ED Provider Notes (Signed)
 Emergency Medicine Observation Re-evaluation Note  [name] is a 48 y.o. male, seen on rounds today.  Pt initially presented to the ED for complaints of Homicidal Currently, the patient is resting, voices no medical complaints.  Physical Exam  BP (!) 139/92 (BP Location: Left Arm)   Pulse 60   Temp 98.7 F (37.1 C) (Oral)   Resp 16   Ht 6\' 2"  (1.88 m)   Wt 70.3 kg   SpO2 100%   BMI 19.90 kg/m  Physical Exam General: Resting in no acute distress Cardiac: No cyanosis Lungs: Equal rise and fall Psych: Not agitated  ED Course / MDM  EKG:   I have reviewed the labs performed to date as well as medications administered while in observation.  Recent changes in the last 24 hours include no events overnight.  Plan  Current plan is for psychiatric disposition.    Irean Hong, MD 01/27/24 [phone]

## 2024-01-27 NOTE — ED Notes (Signed)
 Patient received Hospital Lunch tray.

## 2024-01-27 NOTE — ED Notes (Signed)
VOL  PENDING  PLACEMENT 

## 2024-01-27 NOTE — ED Notes (Signed)
 Pt given water

## 2024-01-27 NOTE — BH Assessment (Signed)
 Per [facility] Alcario Drought), patient to be referred out of system.  Referral information for Psychiatric Hospitalization faxed to;   [facility] [phone]- [phone]) No available beds  [facility] [phone]- [phone]),   [facility] [phone]),  [address] [phone]),   [facility] [phone] -or- [phone]),   [facility] [phone])  [facility] [phone])

## 2024-01-28 DIAGNOSIS — R45850 Homicidal ideations: Secondary | ICD-10-CM

## 2024-01-28 NOTE — ED Provider Notes (Signed)
 Notified by RN that patient had been evaluated by psychiatry and was cleared for discharge.  Reviewed messaging from NP Nedra Hai with psychiatry team.  Patient is recommended for discharge back to his group home.  Patient evaluated, denies SI, HI.  Comfortable with plan for discharge.  Strict return precautions provided.   Trinna Post, MD 01/28/24 [phone]

## 2024-01-28 NOTE — ED Notes (Signed)
 RN called to make legal guardian aware of pt being discharged back to group home. Per psych NP, group home will be picking up pt at 10:30 am this morning. Legal guardian contacted successfully. Legal guardian denies any further questions at this time.

## 2024-01-28 NOTE — ED Notes (Signed)
VOL/Pending Placement 

## 2024-01-28 NOTE — Consult Note (Cosign Needed Addendum)
 [facility] Follow-up  Patient Name: .REED DADY  MRN: [id number]  DOB: [birth year]  Consult Order details:  Orders (From admission, onward)     Start     Ordered   01/26/24 1730  CONSULT TO CALL ACT TEAM       Ordering Provider: Janith Lima, MD  Provider:  (Not yet assigned)  Question:  Reason for Consult?  Answer:  Psych consult   01/26/24 1729   01/26/24 1729  IP CONSULT TO PSYCHIATRY       Ordering Provider: Janith Lima, MD  Provider:  (Not yet assigned)  Question Answer Comment  Place call to: psych   Reason for Consult Admit   Diagnosis/Clinical Info for Consult: homicidal ideation      01/26/24 1729            Mode of Visit: Telehealth visit. NP in state of [place]. Patient in state of [place].    Psychiatry Consult Evaluation  Service Date: January 28, 2024 LOS:  LOS: 0 days  Chief Complaint "trying to do the best I can to cope with the world"  Primary Psychiatric Diagnoses  Homicidal ideation  Historical diagnosis of intellectual disability  Assessment  [name] is a 48 y.o. male w/ history of intellectual disability admitted to [facility] on 01/26/24 for c/o HI towards another resident at the group home.   On assessment today, patient reports he was brought to the emergency department because of conflict in his group home with other residents. He reports he would like to live in another group home. He denies suicidal ideations, homicidal ideations, auditory visual hallucinations or paranoia. There is no evidence of agitation, aggression, distractibility or internal preoccupation. No paranoia or delusions elicited. He is calm, pleasant, cooperative. Collateral obtained from mother/legal guardian and group home. Patient has been off his psychotropic for about a year. Mother/legal guardian and group home both deny immediate safety concerns and are in agreement with discharge, patient to follow up with outpatient  behavioral health services.  Diagnoses:  Active Hospital problems: Principal Problem:   Homicidal ideation   Plan   ## Psychiatric Medication Recommendations:  -Defer to outpatient behavioral health services  ## Medical Decision Making Capacity: Not specifically addressed in this encounter  ## Further Work-up:  -- Defer to EDP   ## Disposition:-- There are no psychiatric contraindications to discharge at this time  ## Behavioral / Environmental: -Utilize compassion and acknowledge the patient's experiences while setting clear and realistic expectations for care.    ## Safety and Observation Level:  - Based on my clinical evaluation, I estimate the patient to be at LOW risk of self harm in the current setting. - At this time, we recommend  routine safety precautions. This decision is based on my review of the chart including patient's history and current presentation, interview of the patient, mental status examination, and consideration of suicide risk including evaluating suicidal ideation, plan, intent, suicidal or self-harm behaviors, risk factors, and protective factors. This judgment is based on our ability to directly address suicide risk, implement suicide prevention strategies, and develop a safety plan while the patient is in the clinical setting. Please contact our team if there is a concern that risk level has changed.  CSSR Risk Category:C-SSRS RISK CATEGORY: High Risk  Suicide Risk Assessment: Patient has following risk factors for suicide: male gender, history of suicide attempt, and psychiatric hospitalization Patient has the following protective factors against suicide: Access to outpatient  mental health care, Supportive family, Frustration tolerance, and no history of NSSIB  Thank you for this consult request. Recommendations have been communicated to the primary team.  We will psychiatrically clear at this time.   Lauree Chandler, NP       History of Present  Illness  Patient Report:  Patient reports he was brought to the emergency department because he is not getting along with residents at his group home. Reports one of the residents at the group home coughs without covering his mouth and another one is not a good friend and talks about his mother. He states he also feels that his group home staff does not like him and when he speaks up he is told he is giving them a headache and is disrespectful. He reports he would like to live in another group home. He denies suicidal ideations, homicidal ideations, auditory visual hallucinations or paranoia. He states he has good sleep and good appetite.   He reports past history of 1 suicide attempt. No suicide attempts since living in group home where has has been for 10 years. He denies history of non suicidal self injurious behavior. He reports 6 or 7past inpatient psychiatric hospitalizations, last occurring in the remote past.   He denies knowledge of family psychiatric history of family history of suicide.  He denies access to firearms or other weapons.   He endorses smoking 1 ppd of cigarettes.  Psych ROS:  Denies suicidal ideations Denies homicidal ideations Denies auditory visual hallucinations Denies paranoia  Collateral information:  Spoke with mother/legal guardian [name] Via [phone]. Patient has been off his LAI for about a year. She denies immediate safety concerns. She is in agreement with plan to discharge patient back to group home, patient to follow up with outpatient behavioral health services.  Spoke with group home, [place], [phone]. [name] states patient has been off his Invega LAI for about a year. He states he had brought patient to outpatient psychiatry at [facility] and was told to have patient go to inpatient psychiatry for several days to have patient re-started on medications. He did not have immediate safety concerns regarding patient and is in agreement with discharge.  He does not have concerns that patient would attempt to harm other residents. He states he is aware patient has been having arguments with other group home residents including [name]. He states patient and [name] argue over cigarettes and girls. [name] also does make remarks regarding his mother which upsets patient. He states he can pick patient up from the hospital today and follow up with outpatient behavioral health services. Safety planning completed with [name], including: Frequent conversations regarding unsafe thoughts. Locking/monitoring the use of all significant sharps, including knives, razor blades, pencil sharpener razors. If there is a firearm in the home, keeping the firearm unloaded, locking the firearm, locking the ammunition separately from the firearm, preventing access to the firearm and the ammunition. Locking/monitoring the use of medications, including over-the-counter medications and supplements. Having a responsible person dispense medications until patient has strengthened coping skills. Room checks for sharps or other harmful objects. Secure all chemical substances that can be ingested or inhaled. Securing any ligature risks. Calling 911/EMS or going to the nearest emergency room for any worsening of condition.  Review of Systems  Constitutional:  Negative for chills and fever.  Respiratory:  Negative for shortness of breath.   Cardiovascular:  Negative for chest pain.  Psychiatric/Behavioral: Negative.       Psychiatric and Social  History  Psychiatric History:   Prev Dx/Sx: Intellectual disability Current Psych Provider: [place] [facility] Med[facility] (current): None Previous Med Trials: Invega  Prior Psych Hospitalization: Yes, patient reports 6 or 7, last occurring in the remote past Prior Self Harm: Patient reports 1 suicide attempt in the remote past, no suicide attempts since living in group home where he has been for 10 years  Family Psych History: Patient denies  knowledge Family Hx suicide: Patient denies knowledge  Social History:  Living in group home. Has been with group home for past 10 years.  Access to weapons/lethal means: Patient denies access to firearms or other weapons.   Substance History Patient endorses smoking 1 ppd of cigarettes.  Exam Findings   Vital Bryant:  Temp:  [98.7 F (37.1 C)-99 F (37.2 C)] 98.7 F (37.1 C) (02/19 0851) Pulse Rate:  [63-72] 72 (02/19 0851) Resp:  [18] 18 (02/19 0851) BP: (114-118)/(67-69) 114/69 (02/19 0851) SpO2:  [96 %] 96 % (02/19 0851) Blood pressure 114/69, pulse 72, temperature 98.7 F (37.1 C), temperature source Oral, resp. rate 18, height 6\' 2"  (1.88 m), weight 70.3 kg, SpO2 96%. Body mass index is 19.9 kg/m.  Physical Exam Constitutional:      General: He is not in acute distress.    Appearance: He is not ill-appearing, toxic-appearing or diaphoretic.  Pulmonary:     Effort: Pulmonary effort is normal. No respiratory distress.  Neurological:     Mental Status: He is alert and oriented to person, place, and time.     Mental Status Exam: General appearance: Appropriate for environment Orientation: Full (time, place, person) Memory: Grossly intact Concentration: Grossly intact Recall: Grossly intact Attention: Grossly intact Eye contact: Fair Speech: Missing dentition, affected speech Language: Fair Mood: "ok" Affect: Full range, laughing appropriately Thought Process: Coherent Thought Content: WNL Suicidal thoughts: No Homicidal thoughts: No Judgment: Intact Insight: Present Psychomotor activity: Appears to have tardive dyskinesia (oral) Akathisia: No Fund of knowledge: Grossly intact Assets: Communication skills, desire for improvement, resilience, social support Cognition: Grossly intact ADLs: Intact  Other History   These have been pulled in through the EMR, reviewed, and updated if appropriate.  Family History:  The patient's family history is not on  file.  Medical History: Past Medical History:  Diagnosis Date   Bipolar 1 disorder (HCC)    Intellectual disability 01/01/2022   Schizo affective schizophrenia [facility])    Surgical History: Past Surgical History:  Procedure Laterality Date   COLONOSCOPY WITH PROPOFOL N/A 05/03/2021   Procedure: COLONOSCOPY WITH PROPOFOL;  Surgeon: Midge Minium, MD;  Location: [facility] ENDOSCOPY;  Service: Endoscopy;  Laterality: N/A;   JOINT REPLACEMENT     Medications:  No current facility-administered medications for this encounter.  Current Outpatient Medications:    INVEGA TRINZA 546 MG/1.75ML SUSY, Inject into the muscle., Disp: , Rfl:    Na Sulfate-K Sulfate-Mg Sulf (SUPREP BOWEL PREP KIT) 17.5-3.13-1.6 GM/177ML SOLN, Take 1 kit by mouth as directed. (Patient not taking: Reported on 01/26/2024), Disp: 354 mL, Rfl: 0  Allergies: No Known Allergies  Lauree Chandler, NP

## 2024-01-28 NOTE — ED Notes (Signed)
 Hospital meal provided.  100% consumed, pt tolerated w/o complaints.  Waste discarded appropriately.

## 2024-01-28 NOTE — ED Notes (Signed)
Pt given drink at this time 

## 2024-01-28 NOTE — Discharge Instructions (Signed)
Return to the ER for new or worsening symptoms. °

## 2024-01-28 NOTE — ED Notes (Signed)
 Pt showering at this time. Pt provided hygiene materials.

## 2024-01-28 NOTE — ED Notes (Signed)
 Pt requesting snack before breakfast. Pt given snack.

## 2024-01-28 NOTE — ED Provider Notes (Signed)
 Emergency Medicine Observation Re-evaluation Note  [name] is a 48 y.o. male, seen on rounds today.  Pt initially presented to the ED for complaints of Homicidal Currently, the patient is resting, voices no medical complaints.  Physical Exam  BP 118/67 (BP Location: Right Arm)   Pulse 63   Temp 99 F (37.2 C) (Oral)   Resp 18   Ht 6\' 2"  (1.88 m)   Wt 70.3 kg   SpO2 96%   BMI 19.90 kg/m  Physical Exam General: Resting in no acute distress Cardiac: No cyanosis Lungs: Equal rise and fall Psych: Not agitated  ED Course / MDM  EKG:   I have reviewed the labs performed to date as well as medications administered while in observation.  Recent changes in the last 24 hours include no events overnight.  Plan  Current plan is for psychiatric disposition.    Irean Hong, MD 01/28/24 [phone]
